# Patient Record
Sex: Female | Born: 1940
Health system: Southern US, Community
[De-identification: ages and names within clinical notes are randomized; demographics above are authoritative.]

## PROBLEM LIST (undated history)

## (undated) DIAGNOSIS — T7840XA Allergy, unspecified, initial encounter: Secondary | ICD-10-CM

## (undated) DIAGNOSIS — C3490 Malignant neoplasm of unspecified part of unspecified bronchus or lung: Secondary | ICD-10-CM

## (undated) HISTORY — DX: Malignant neoplasm of unspecified part of unspecified bronchus or lung: C34.90

## (undated) HISTORY — DX: Allergy, unspecified, initial encounter: T78.40XA

## (undated) HISTORY — PX: VAGINAL HYSTERECTOMY: SUR661

---

## 2017-04-04 DIAGNOSIS — R030 Elevated blood-pressure reading, without diagnosis of hypertension: Secondary | ICD-10-CM | POA: Diagnosis not present

## 2017-04-04 DIAGNOSIS — J309 Allergic rhinitis, unspecified: Secondary | ICD-10-CM | POA: Diagnosis not present

## 2017-04-04 DIAGNOSIS — R0602 Shortness of breath: Secondary | ICD-10-CM | POA: Diagnosis not present

## 2017-04-24 DIAGNOSIS — Z1389 Encounter for screening for other disorder: Secondary | ICD-10-CM | POA: Diagnosis not present

## 2017-04-24 DIAGNOSIS — R030 Elevated blood-pressure reading, without diagnosis of hypertension: Secondary | ICD-10-CM | POA: Diagnosis not present

## 2017-04-24 DIAGNOSIS — Z789 Other specified health status: Secondary | ICD-10-CM | POA: Diagnosis not present

## 2017-04-24 DIAGNOSIS — R5383 Other fatigue: Secondary | ICD-10-CM | POA: Diagnosis not present

## 2017-05-01 DIAGNOSIS — J309 Allergic rhinitis, unspecified: Secondary | ICD-10-CM | POA: Diagnosis not present

## 2017-05-01 DIAGNOSIS — R319 Hematuria, unspecified: Secondary | ICD-10-CM | POA: Diagnosis not present

## 2017-05-01 DIAGNOSIS — N183 Chronic kidney disease, stage 3 (moderate): Secondary | ICD-10-CM | POA: Diagnosis not present

## 2017-05-01 DIAGNOSIS — Z0001 Encounter for general adult medical examination with abnormal findings: Secondary | ICD-10-CM | POA: Diagnosis not present

## 2017-05-01 DIAGNOSIS — N390 Urinary tract infection, site not specified: Secondary | ICD-10-CM | POA: Diagnosis not present

## 2017-05-10 DIAGNOSIS — N390 Urinary tract infection, site not specified: Secondary | ICD-10-CM | POA: Diagnosis not present

## 2017-05-20 ENCOUNTER — Encounter: Payer: Self-pay | Admitting: *Deleted

## 2017-05-23 DIAGNOSIS — J310 Chronic rhinitis: Secondary | ICD-10-CM | POA: Diagnosis not present

## 2017-05-30 ENCOUNTER — Other Ambulatory Visit (HOSPITAL_COMMUNITY): Payer: Self-pay | Admitting: Pulmonary Disease

## 2017-05-30 DIAGNOSIS — J90 Pleural effusion, not elsewhere classified: Secondary | ICD-10-CM

## 2017-05-31 ENCOUNTER — Other Ambulatory Visit (HOSPITAL_COMMUNITY): Payer: Self-pay | Admitting: Pulmonary Disease

## 2017-05-31 ENCOUNTER — Ambulatory Visit (HOSPITAL_COMMUNITY)
Admission: RE | Admit: 2017-05-31 | Discharge: 2017-05-31 | Disposition: A | Discharge status: Home / Self Care | Payer: Medicare Other | Source: Ambulatory Visit | Attending: Pulmonary Disease | Admitting: Pulmonary Disease

## 2017-05-31 ENCOUNTER — Encounter (HOSPITAL_COMMUNITY): Payer: Self-pay

## 2017-05-31 DIAGNOSIS — K769 Liver disease, unspecified: Secondary | ICD-10-CM | POA: Diagnosis not present

## 2017-05-31 DIAGNOSIS — R590 Localized enlarged lymph nodes: Secondary | ICD-10-CM | POA: Diagnosis not present

## 2017-05-31 DIAGNOSIS — J90 Pleural effusion, not elsewhere classified: Secondary | ICD-10-CM

## 2017-05-31 DIAGNOSIS — C779 Secondary and unspecified malignant neoplasm of lymph node, unspecified: Secondary | ICD-10-CM | POA: Diagnosis not present

## 2017-05-31 DIAGNOSIS — R918 Other nonspecific abnormal finding of lung field: Secondary | ICD-10-CM | POA: Diagnosis not present

## 2017-05-31 LAB — POCT I-STAT CREATININE: CREATININE: 1.5 mg/dL — AB (ref 0.44–1.00)

## 2017-05-31 MED ORDER — IOPAMIDOL (ISOVUE-300) INJECTION 61%
100.0000 mL | Freq: Once | INTRAVENOUS | Status: AC | PRN
  Administered 2017-05-31: 60 mL via INTRAVENOUS

## 2017-05-31 NOTE — Sedation Documentation (Signed)
No thoracentesis done.  Pt to go for chest CT.

## 2017-06-05 DIAGNOSIS — J449 Chronic obstructive pulmonary disease, unspecified: Secondary | ICD-10-CM | POA: Diagnosis not present

## 2017-06-05 DIAGNOSIS — R918 Other nonspecific abnormal finding of lung field: Secondary | ICD-10-CM | POA: Diagnosis not present

## 2017-06-05 DIAGNOSIS — E46 Unspecified protein-calorie malnutrition: Secondary | ICD-10-CM | POA: Diagnosis not present

## 2017-06-10 NOTE — Patient Instructions (Signed)
Brandy Gray  06/10/2017     _0 @   Your procedure is scheduled on  06/13/2017 .  Report to [facility] at  615   A.M.  Call this number if you have problems the morning of surgery:  [phone]   Remember:  No food after midnight.  You may drink clear liquids until 12 midnight 06/12/2017 .  Clear liquids allowed are:                    Water, Juice (non-citric and without pulp), Carbonated beverages, Clear Tea, Black Coffee only, Plain Jell-O only, Gatorade and Plain Popsicles only    Take these medicines the morning of surgery with A SIP OF WATER  none    Do not wear jewelry, make-up or nail polish.  Do not wear lotions, powders, or perfumes, or deodorant.  Do not shave 48 hours prior to surgery.  Men may shave face and neck.  Do not bring valuables to the hospital.  [facility] is not responsible for any belongings or valuables.  Contacts, dentures or bridgework may not be worn into surgery.  Leave your suitcase in the car.  After surgery it may be brought to your room.  For patients admitted to the hospital, discharge time will be determined by your treatment team.  Patients discharged the day of surgery will not be allowed to drive home.   Name and phone number of your driver:   family Special instructions:  None  Please read over the following fact sheets that you were given. Pain Booklet, Coughing and Deep Breathing, Anesthesia Post-op Instructions and Care and Recovery After Surgery      Flexible Bronchoscopy Bronchoscopy is a procedure used to examine the passageways in the lungs. During the procedure a thin, flexible tool with a lens and camera or eyepiece is passed in your mouth or nose, down the windpipe (trachea), and into the air tubes (bronchi). This tool allows your health care provider to carefully look at your lungs from the inside and take diagnostic samples if needed. Tell a health care provider about:  Allergies to food  or medicine.  All medicines you are taking, including blood thinners, vitamins, herbs, eye drops, creams, and over-the-counter medicines.  Any problems you or family members have had with anesthetic medicines.  Any blood disorders you have.  Any surgeries you have had.  Medical conditions you have, including heart disease, diabetes, or kidney problems.  Possibility of pregnancy, if this applies. What are the risks? Generally, this is a safe procedure. However, as with any procedure, problems can occur. Possible problems include:  Collapsed lung (pneumothorax).  Bleeding.  Increased need for oxygen or difficulty breathing after the procedure.  What happens before the procedure? Do not eat or drink anything after midnight on the night before the procedure or as directed by your health care provider. What happens during the procedure?  Relax as much as possible during the procedure.  Medicines may be given to relax you, dry up your secretions, and control coughing.  A numbing medicine (local anesthetic) will be given to numb your mouth, nose, throat, and voice box (larynx). You will be able to breathe normally during the procedure.  Samples of airway secretions may be collected for testing.  If abnormal areas are seen in your airways, tissue samples may be taken for examination under a microscope (biopsy).  If tissue samples are needed from the  outer portions of the lung, a type of X-ray called fluoroscopy may be done.  If bleeding occurs, a drug may be used to stop or decrease the bleeding. What happens after the procedure?  You may receive a chest X-ray following the procedure. This is to make sure the lungs have not collapsed (pneumothorax). This information is not intended to replace advice given to you by your health care provider. Make sure you discuss any questions you have with your health care provider. Document Released: 12/23/1999 Document Revised: 06/02/2015  Document Reviewed: 08/29/2012 Elsevier Interactive Patient Education  2017 [place].  Flexible Bronchoscopy, Care After These instructions give you information on caring for yourself after your procedure. Your doctor may also give you more specific instructions. Call your doctor if you have any problems or questions after your procedure. Follow these instructions at home:  Do not eat or drink anything for 2 hours after your procedure. If you try to eat or drink before the medicine wears off, food or drink could go into your lungs. You could also burn yourself.  After 2 hours have passed and when you can cough and gag normally, you may eat soft food and drink liquids slowly.  The day after the test, you may eat your normal diet.  You may do your normal activities.  Keep all doctor visits. Get help right away if:  You get more and more short of breath.  You get light-headed.  You feel like you are going to pass out (faint).  You have chest pain.  You have new problems that worry you.  You cough up more than a little blood.  You cough up more blood than before. This information is not intended to replace advice given to you by your health care provider. Make sure you discuss any questions you have with your health care provider. Document Released: 10/22/2008 Document Revised: 06/02/2015 Document Reviewed: 08/29/2012 Elsevier Interactive Patient Education  2017 [place] Anesthesia is a term that refers to techniques, procedures, and medicines that help a person stay safe and comfortable during a medical procedure. Monitored anesthesia care, or sedation, is one type of anesthesia. Your anesthesia specialist may recommend sedation if you will be having a procedure that does not require you to be unconscious, such as:  Cataract surgery.  A dental procedure.  A biopsy.  A colonoscopy.  During the procedure, you may receive a medicine to help  you relax (sedative). There are three levels of sedation:  Mild sedation. At this level, you may feel awake and relaxed. You will be able to follow directions.  Moderate sedation. At this level, you will be sleepy. You may not remember the procedure.  Deep sedation. At this level, you will be asleep. You will not remember the procedure.  The more medicine you are given, the deeper your level of sedation will be. Depending on how you respond to the procedure, the anesthesia specialist may change your level of sedation or the type of anesthesia to fit your needs. An anesthesia specialist will monitor you closely during the procedure. Let your health care provider know about:  Any allergies you have.  All medicines you are taking, including vitamins, herbs, eye drops, creams, and over-the-counter medicines.  Any use of steroids (by mouth or as a cream).  Any problems you or family members have had with sedatives and anesthetic medicines.  Any blood disorders you have.  Any surgeries you have had.  Any medical conditions  you have, such as sleep apnea.  Whether you are pregnant or may be pregnant.  Any use of cigarettes, alcohol, or street drugs. What are the risks? Generally, this is a safe procedure. However, problems may occur, including:  Getting too much medicine (oversedation).  Nausea.  Allergic reaction to medicines.  Trouble breathing. If this happens, a breathing tube may be used to help with breathing. It will be removed when you are awake and breathing on your own.  Heart trouble.  Lung trouble.  Before the procedure Staying hydrated Follow instructions from your health care provider about hydration, which may include:  Up to 2 hours before the procedure - you may continue to drink clear liquids, such as water, clear fruit juice, black coffee, and plain tea.  Eating and drinking restrictions Follow instructions from your health care provider about eating and  drinking, which may include:  8 hours before the procedure - stop eating heavy meals or foods such as meat, fried foods, or fatty foods.  6 hours before the procedure - stop eating light meals or foods, such as toast or cereal.  6 hours before the procedure - stop drinking milk or drinks that contain milk.  2 hours before the procedure - stop drinking clear liquids.  Medicines Ask your health care provider about:  Changing or stopping your regular medicines. This is especially important if you are taking diabetes medicines or blood thinners.  Taking medicines such as aspirin and ibuprofen. These medicines can thin your blood. Do not take these medicines before your procedure if your health care provider instructs you not to.  Tests and exams  You will have a physical exam.  You may have blood tests done to show: ? How well your kidneys and liver are working. ? How well your blood can clot.  General instructions  Plan to have someone take you home from the hospital or clinic.  If you will be going home right after the procedure, plan to have someone with you for 24 hours.  What happens during the procedure?  Your blood pressure, heart rate, breathing, level of pain and overall condition will be monitored.  An IV tube will be inserted into one of your veins.  Your anesthesia specialist will give you medicines as needed to keep you comfortable during the procedure. This may mean changing the level of sedation.  The procedure will be performed. After the procedure  Your blood pressure, heart rate, breathing rate, and blood oxygen level will be monitored until the medicines you were given have worn off.  Do not drive for 24 hours if you received a sedative.  You may: ? Feel sleepy, clumsy, or nauseous. ? Feel forgetful about what happened after the procedure. ? Have a sore throat if you had a breathing tube during the procedure. ? Vomit. This information is not intended  to replace advice given to you by your health care provider. Make sure you discuss any questions you have with your health care provider. Document Released: 09/20/2004 Document Revised: 06/03/2015 Document Reviewed: 04/17/2015 Elsevier Interactive Patient Education  2018 [place], Care After These instructions provide you with information about caring for yourself after your procedure. Your health care provider may also give you more specific instructions. Your treatment has been planned according to current medical practices, but problems sometimes occur. Call your health care provider if you have any problems or questions after your procedure. What can I expect after the procedure? After your  procedure, it is common to:  Feel sleepy for several hours.  Feel clumsy and have poor balance for several hours.  Feel forgetful about what happened after the procedure.  Have poor judgment for several hours.  Feel nauseous or vomit.  Have a sore throat if you had a breathing tube during the procedure.  Follow these instructions at home: For at least 24 hours after the procedure:   Do not: ? Participate in activities in which you could fall or become injured. ? Drive. ? Use heavy machinery. ? Drink alcohol. ? Take sleeping pills or medicines that cause drowsiness. ? Make important decisions or sign legal documents. ? Take care of children on your own.  Rest. Eating and drinking  Follow the diet that is recommended by your health care provider.  If you vomit, drink water, juice, or soup when you can drink without vomiting.  Make sure you have little or no nausea before eating solid foods. General instructions  Have a responsible adult stay with you until you are awake and alert.  Take over-the-counter and prescription medicines only as told by your health care provider.  If you smoke, do not smoke without supervision.  Keep all follow-up visits  as told by your health care provider. This is important. Contact a health care provider if:  You keep feeling nauseous or you keep vomiting.  You feel light-headed.  You develop a rash.  You have a fever. Get help right away if:  You have trouble breathing. This information is not intended to replace advice given to you by your health care provider. Make sure you discuss any questions you have with your health care provider. Document Released: 04/17/2015 Document Revised: 08/17/2015 Document Reviewed: 04/17/2015 Elsevier Interactive Patient Education  [employer].

## 2017-06-11 ENCOUNTER — Other Ambulatory Visit: Payer: Self-pay

## 2017-06-11 ENCOUNTER — Encounter (HOSPITAL_COMMUNITY): Payer: Self-pay

## 2017-06-11 ENCOUNTER — Encounter (HOSPITAL_COMMUNITY)
Admission: RE | Admit: 2017-06-11 | Discharge: 2017-06-11 | Disposition: A | Discharge status: Home / Self Care | Payer: Medicare Other | Source: Ambulatory Visit | Attending: Pulmonary Disease | Admitting: Pulmonary Disease

## 2017-06-11 DIAGNOSIS — C3401 Malignant neoplasm of right main bronchus: Secondary | ICD-10-CM | POA: Diagnosis not present

## 2017-06-11 DIAGNOSIS — R918 Other nonspecific abnormal finding of lung field: Secondary | ICD-10-CM | POA: Diagnosis present

## 2017-06-11 DIAGNOSIS — F17210 Nicotine dependence, cigarettes, uncomplicated: Secondary | ICD-10-CM | POA: Diagnosis not present

## 2017-06-11 DIAGNOSIS — J449 Chronic obstructive pulmonary disease, unspecified: Secondary | ICD-10-CM | POA: Diagnosis not present

## 2017-06-11 DIAGNOSIS — N189 Chronic kidney disease, unspecified: Secondary | ICD-10-CM | POA: Diagnosis not present

## 2017-06-11 LAB — CBC WITH DIFFERENTIAL/PLATELET
BASOS PCT: 0 %
Basophils Absolute: 0 10*3/uL (ref 0.0–0.1)
Eosinophils Absolute: 0.1 10*3/uL (ref 0.0–0.7)
Eosinophils Relative: 1 %
HEMATOCRIT: 47.2 % — AB (ref 36.0–46.0)
HEMOGLOBIN: 15.2 g/dL — AB (ref 12.0–15.0)
Lymphocytes Relative: 10 %
Lymphs Abs: 1.1 10*3/uL (ref 0.7–4.0)
MCH: 30.8 pg (ref 26.0–34.0)
MCHC: 32.2 g/dL (ref 30.0–36.0)
MCV: 95.7 fL (ref 78.0–100.0)
Monocytes Absolute: 0.9 10*3/uL (ref 0.1–1.0)
Monocytes Relative: 8 %
NEUTROS ABS: 9 10*3/uL — AB (ref 1.7–7.7)
NEUTROS PCT: 81 %
Platelets: 232 10*3/uL (ref 150–400)
RBC: 4.93 MIL/uL (ref 3.87–5.11)
RDW: 13.6 % (ref 11.5–15.5)
WBC: 11 10*3/uL — ABNORMAL HIGH (ref 4.0–10.5)

## 2017-06-11 LAB — BASIC METABOLIC PANEL
ANION GAP: 13 (ref 5–15)
BUN: 52 mg/dL — ABNORMAL HIGH (ref 6–20)
CALCIUM: 9.9 mg/dL (ref 8.9–10.3)
CHLORIDE: 103 mmol/L (ref 101–111)
CO2: 26 mmol/L (ref 22–32)
Creatinine, Ser: 1.5 mg/dL — ABNORMAL HIGH (ref 0.44–1.00)
GFR calc non Af Amer: 32 mL/min — ABNORMAL LOW (ref >60)
GFR, EST AFRICAN AMERICAN: 37 mL/min — AB (ref >60)
Glucose, Bld: 128 mg/dL — ABNORMAL HIGH (ref 65–99)
POTASSIUM: 3.8 mmol/L (ref 3.5–5.1)
Sodium: 142 mmol/L (ref 135–145)

## 2017-06-11 NOTE — Pre-Procedure Instructions (Signed)
Labs routed to PCP and to Dr Luan Pulling.

## 2017-06-13 ENCOUNTER — Encounter (HOSPITAL_COMMUNITY): Payer: Self-pay | Admitting: *Deleted

## 2017-06-13 ENCOUNTER — Ambulatory Visit (HOSPITAL_COMMUNITY)
Admission: RE | Admit: 2017-06-13 | Discharge: 2017-06-13 | Disposition: A | Discharge status: Home / Self Care | Payer: Medicare Other | Source: Ambulatory Visit | Attending: Pulmonary Disease | Admitting: Pulmonary Disease

## 2017-06-13 ENCOUNTER — Ambulatory Visit: Payer: Medicare Other | Admitting: Family Medicine

## 2017-06-13 ENCOUNTER — Encounter (HOSPITAL_COMMUNITY)
Admission: RE | Disposition: A | Discharge status: Home / Self Care | Payer: Self-pay | Source: Ambulatory Visit | Attending: Pulmonary Disease

## 2017-06-13 ENCOUNTER — Ambulatory Visit (HOSPITAL_COMMUNITY): Payer: Medicare Other | Admitting: Anesthesiology

## 2017-06-13 DIAGNOSIS — J9809 Other diseases of bronchus, not elsewhere classified: Secondary | ICD-10-CM | POA: Diagnosis not present

## 2017-06-13 DIAGNOSIS — J449 Chronic obstructive pulmonary disease, unspecified: Secondary | ICD-10-CM | POA: Insufficient documentation

## 2017-06-13 DIAGNOSIS — R918 Other nonspecific abnormal finding of lung field: Secondary | ICD-10-CM | POA: Diagnosis not present

## 2017-06-13 DIAGNOSIS — N189 Chronic kidney disease, unspecified: Secondary | ICD-10-CM | POA: Diagnosis not present

## 2017-06-13 DIAGNOSIS — C3491 Malignant neoplasm of unspecified part of right bronchus or lung: Secondary | ICD-10-CM | POA: Diagnosis not present

## 2017-06-13 DIAGNOSIS — R845 Abnormal microbiological findings in specimens from respiratory organs and thorax: Secondary | ICD-10-CM | POA: Diagnosis not present

## 2017-06-13 DIAGNOSIS — F17210 Nicotine dependence, cigarettes, uncomplicated: Secondary | ICD-10-CM | POA: Diagnosis not present

## 2017-06-13 DIAGNOSIS — C3401 Malignant neoplasm of right main bronchus: Secondary | ICD-10-CM | POA: Insufficient documentation

## 2017-06-13 HISTORY — PX: FLEXIBLE BRONCHOSCOPY: SHX5094

## 2017-06-13 HISTORY — PX: BIOPSY: SHX5522

## 2017-06-13 SURGERY — BRONCHOSCOPY, FLEXIBLE
Anesthesia: General

## 2017-06-13 MED ORDER — LACTATED RINGERS IV SOLN
INTRAVENOUS | Status: DC
  Administered 2017-06-13: 1000 mL via INTRAVENOUS

## 2017-06-13 MED ORDER — LIDOCAINE HCL (PF) 2 % IJ SOLN
INTRAMUSCULAR | Status: AC
Start: 2017-06-13 — End: ?
  Filled 2017-06-13: qty 10

## 2017-06-13 MED ORDER — EPHEDRINE SULFATE 50 MG/ML IJ SOLN
INTRAMUSCULAR | Status: AC
  Filled 2017-06-13: qty 2

## 2017-06-13 MED ORDER — LIDOCAINE HCL (PF) 1 % IJ SOLN
INTRAMUSCULAR | Status: AC
  Filled 2017-06-13: qty 5

## 2017-06-13 MED ORDER — HYDROMORPHONE HCL 1 MG/ML IJ SOLN: 0.2500 mg | INTRAMUSCULAR | Status: DC | PRN

## 2017-06-13 MED ORDER — LIDOCAINE HCL (PF) 2 % IJ SOLN
INTRAMUSCULAR | Status: DC | PRN
  Administered 2017-06-13: 10 mL

## 2017-06-13 MED ORDER — KETOROLAC TROMETHAMINE 30 MG/ML IJ SOLN: 30.0000 mg | Freq: Once | INTRAMUSCULAR | Status: DC | PRN

## 2017-06-13 MED ORDER — ONDANSETRON HCL 4 MG/2ML IJ SOLN: 4.0000 mg | Freq: Once | INTRAMUSCULAR | Status: DC | PRN

## 2017-06-13 MED ORDER — PROPOFOL 10 MG/ML IV BOLUS
INTRAVENOUS | Status: AC
  Filled 2017-06-13: qty 20

## 2017-06-13 MED ORDER — LIDOCAINE HCL (PF) 2 % IJ SOLN
INTRAMUSCULAR | Status: AC
  Filled 2017-06-13: qty 20

## 2017-06-13 MED ORDER — PROPOFOL 10 MG/ML IV BOLUS
INTRAVENOUS | Status: DC | PRN
  Administered 2017-06-13: 10 mg via INTRAVENOUS
  Administered 2017-06-13: 20 mg via INTRAVENOUS
  Administered 2017-06-13: 10 mg via INTRAVENOUS

## 2017-06-13 MED ORDER — HYDROCODONE-ACETAMINOPHEN 7.5-325 MG PO TABS: 1.0000 | ORAL_TABLET | Freq: Once | ORAL | Status: DC | PRN

## 2017-06-13 MED ORDER — MEPERIDINE HCL 100 MG/ML IJ SOLN: 6.2500 mg | INTRAMUSCULAR | Status: DC | PRN

## 2017-06-13 MED ORDER — MIDAZOLAM HCL 2 MG/2ML IJ SOLN
INTRAMUSCULAR | Status: AC
  Filled 2017-06-13: qty 2

## 2017-06-13 MED ORDER — SODIUM CHLORIDE 0.9 % IJ SOLN
INTRAMUSCULAR | Status: AC
Start: 2017-06-13 — End: ?
  Filled 2017-06-13: qty 10

## 2017-06-13 MED ORDER — PROPOFOL 500 MG/50ML IV EMUL
INTRAVENOUS | Status: DC | PRN
  Administered 2017-06-13: 75 ug/kg/min via INTRAVENOUS

## 2017-06-13 SURGICAL SUPPLY — 16 items
BRUSH CYTOL CELLEBRITY 1.5X140 (MISCELLANEOUS) ×4 IMPLANT
CLOTH BEACON ORANGE TIMEOUT ST (SAFETY) ×4 IMPLANT
CONNECTOR 5 IN 1 STRAIGHT STRL (MISCELLANEOUS) ×4 IMPLANT
FORCEPS BIOP RJ4 1.8 (CUTTING FORCEPS) ×4 IMPLANT
GLOVE BIO SURGEON STRL SZ7.5 (GLOVE) ×4 IMPLANT
KIT CLEAN CATCH URINE (SET/KITS/TRAYS/PACK) IMPLANT
MARKER SKIN DUAL TIP RULER LAB (MISCELLANEOUS) ×4 IMPLANT
NS IRRIG 1000ML POUR BTL (IV SOLUTION) ×4 IMPLANT
SPONGE GAUZE 4X4 12PLY (GAUZE/BANDAGES/DRESSINGS) ×4 IMPLANT
SYR 20CC LL (SYRINGE) ×4 IMPLANT
SYR 30ML LL (SYRINGE) ×4 IMPLANT
SYR CONTROL 10ML LL (SYRINGE) ×4 IMPLANT
TRAP SPECIMEN CP (MISCELLANEOUS) ×4 IMPLANT
VALVE DISPOSABLE (MISCELLANEOUS) ×4 IMPLANT
WATER STERILE IRR 1000ML POUR (IV SOLUTION) ×4 IMPLANT
YANKAUER SUCT BULB TIP 10FT TU (MISCELLANEOUS) ×8 IMPLANT

## 2017-06-13 NOTE — Anesthesia Postprocedure Evaluation (Signed)
Anesthesia Post Note  Patient: Brandy Gray  Procedure(s) Performed: FLEXIBLE BRONCHOSCOPY (N/A ) BIOPSY  Patient location during evaluation: Short Stay Anesthesia Type: General Level of consciousness: awake and alert and patient cooperative Pain management: satisfactory to patient Vital Signs Assessment: post-procedure vital signs reviewed and stable Respiratory status: spontaneous breathing Cardiovascular status: stable Postop Assessment: no apparent nausea or vomiting Anesthetic complications: no     Last Vitals:  Vitals:   06/13/17 0810 06/13/17 0815  BP:  124/70  Pulse:  91  Resp:  18  Temp:    SpO2: 90% 98%    Last Pain:  Vitals:   06/13/17 0805  TempSrc:   PainSc: 0-No pain                 Gary Gabrielsen

## 2017-06-13 NOTE — Op Note (Signed)
Bronchoscopy Procedure Note Brandy Gray [id number] October 30, 1940  Procedure: Bronchoscopy Indications: Diagnostic evaluation of the airways  Procedure Details Consent: Risks of procedure as well as the alternatives and risks of each were explained to the (patient/caregiver).  Consent for procedure obtained. Time Out: Verified patient identification, verified procedure, site/side was marked, verified correct patient position, special equipment/implants available, medications/allergies/relevent history reviewed, required imaging and test results available.  Performed  In preparation for procedure, bronchoscope lubricated. Sedation: Propofol via anesthesia  Airway entered and the following bronchi were examined: Bronchi.  Left upper and lower lobes were normal.  There was an endobronchial lesion that was invading the bronchus just above the carina.  There was a large endobronchial lesion in the right mainstem bronchus. Procedures performed: Brushings performed Bronchoscope removed.    Evaluation Hemodynamic Status: BP stable throughout; O2 sats: stable throughout Patient's Current Condition: stable Specimens:  Sent serosanguinous fluid Complications: No apparent complications Patient did tolerate procedure well.  Endobronchial biopsies of the right main bronchus lesion were done x6.  Brushings were then performed.  Washings were then performed.  All were sent for cytology/pathology  Alinda Egolf L 06/13/2017

## 2017-06-13 NOTE — Discharge Instructions (Signed)
Flexible Bronchoscopy, Care After These instructions give you information on caring for yourself after your procedure. Your doctor may also give you more specific instructions. Call your doctor if you have any problems or questions after your procedure. Follow these instructions at home:  Do not eat or drink anything for 2 hours after your procedure. If you try to eat or drink before the medicine wears off, food or drink could go into your lungs. You could also burn yourself.  After 2 hours have passed and when you can cough and gag normally, you may eat soft food and drink liquids slowly.  The day after the test, you may eat your normal diet.  You may do your normal activities.  Keep all doctor visits. Get help right away if:  You get more and more short of breath.  You get light-headed.  You feel like you are going to pass out (faint).  You have chest pain.  You have new problems that worry you.  You cough up more than a little blood.  You cough up more blood than before. This information is not intended to replace advice given to you by your health care provider. Make sure you discuss any questions you have with your health care provider. Document Released: 10/22/2008 Document Revised: 06/02/2015 Document Reviewed: 08/29/2012 Elsevier Interactive Patient Education  2017 [employer].

## 2017-06-13 NOTE — Anesthesia Preprocedure Evaluation (Signed)
Anesthesia Evaluation  Patient identified by MRN, date of birth, ID band Patient awake    Reviewed: Allergy & Precautions, H&P , NPO status , Patient's Chart, lab work & pertinent test results  Airway Mallampati: II  TM Distance: >3 FB Neck ROM: full    Dental no notable dental hx.    Pulmonary neg pulmonary ROS, Current Smoker,    Pulmonary exam normal breath sounds clear to auscultation       Cardiovascular Exercise Tolerance: Good negative cardio ROS   Rhythm:regular Rate:Normal     Neuro/Psych negative neurological ROS  negative psych ROS   GI/Hepatic negative GI ROS, Neg liver ROS,   Endo/Other  negative endocrine ROS  Renal/GU negative Renal ROS  negative genitourinary   Musculoskeletal   Abdominal   Peds  Hematology negative hematology ROS (+)   Anesthesia Other Findings   Reproductive/Obstetrics negative OB ROS                             Anesthesia Physical Anesthesia Plan  ASA: III  Anesthesia Plan: General   Post-op Pain Management:    Induction:   PONV Risk Score and Plan:   Airway Management Planned:   Additional Equipment:   Intra-op Plan:   Post-operative Plan:   Informed Consent: I have reviewed the patients History and Physical, chart, labs and discussed the procedure including the risks, benefits and alternatives for the proposed anesthesia with the patient or authorized representative who has indicated his/her understanding and acceptance.   Dental Advisory Given  Plan Discussed with: CRNA  Anesthesia Plan Comments:         Anesthesia Quick Evaluation

## 2017-06-13 NOTE — Transfer of Care (Signed)
Immediate Anesthesia Transfer of Care Note  Patient: Brandy Gray  Procedure(s) Performed: FLEXIBLE BRONCHOSCOPY (N/A ) BIOPSY  Patient Location: PACU  Anesthesia Type:MAC  Level of Consciousness: awake and alert   Airway & Oxygen Therapy: Patient Spontanous Breathing  Post-op Assessment: Report given to RN  Post vital signs: Reviewed and stable  Last Vitals:  Vitals Value Taken Time  BP    Temp    Pulse    Resp    SpO2      Last Pain:  Vitals:   06/13/17 0650  TempSrc: Oral  PainSc: 0-No pain      Patients Stated Pain Goal: 5 (21/97/58 8325)  Complications: No apparent anesthesia complications

## 2017-06-13 NOTE — H&P (Signed)
Brandy Gray MRN: [id number] DOB/AGE: November 02, 1940 77 y.o. Primary Care Physician:Pickard, Cammie Mcgee, MD Admit date: 06/13/2017 Chief Complaint: abnormal chest x ray HPI: This is a 77 year old who has been sick for about 3 or 4 months.  She started with what seemed to be an upper respiratory infection and she went to her primary care physician.  She was treated but did not improve.  Eventually she went to an urgent care center where she had a chest x-ray done and this showed that she had a right pleural effusion.  She was referred to my office.  When I saw her she gave a history of about 20 pound weight loss she has a 25+ pack year smoking history and has been coughing.  She is on no regular medications.  She does not have any other medical problems although on her blood work for preop she had some moderate chronic renal failure.  She has been coughing.  Her cough is mostly been nonproductive.  She has not coughed up any blood.  She has not complained of any chest pain except some on the right side of her chest when she takes of breath.  No nausea vomiting or diarrhea.  No headaches.  She had CT of the chest which was markedly abnormal and has been scheduled for bronchoscopy  Past Medical History:  Diagnosis Date  . Allergy    Past Surgical History:  Procedure Laterality Date  . VAGINAL HYSTERECTOMY          History reviewed. No pertinent family history. She is unaware of any family history of lung disease particularly of lung cancer Social History:  reports that she has been smoking cigarettes.  She has a 27.50 pack-year smoking history. She has never used smokeless tobacco. She reports that she does not drink alcohol or use drugs.   Allergies:  Allergies  Allergen Reactions  . Codeine Swelling and Rash    Swelling around eyes    No medications prior to admission.       OHY:WVPXT from the symptoms mentioned above,there are no other symptoms referable to all systems reviewed.  10  point review of systems otherwise negative  Physical Exam: Blood pressure (!) 147/88, pulse 93, temperature 98.2 F (36.8 C), temperature source Oral, resp. rate 20, SpO2 91 %. Constitutional: She is thin.  Eyes: Pupils react EOMI.  Ears nose mouth and throat: Her mucous membranes are moist.  Hearing is grossly normal.  Her neck is supple.  Respiratory: Her respiratory effort is normal.  She has bilateral rhonchi.  Cardiovascular: Her heart is regular with normal heart sounds.  No edema.  Gastrointestinal: Her abdomen is soft with no masses.  Skin: Warm and dry.  Musculoskeletal: Normal strength bilaterally.  Neurological: No focal abnormalities.  Psychiatric: Normal mood and affect   Recent Labs    06/11/17 1342  WBC 11.0*  NEUTROABS 9.0*  HGB 15.2*  HCT 47.2*  MCV 95.7  PLT 232   Recent Labs    06/11/17 1342  NA 142  K 3.8  CL 103  CO2 26  GLUCOSE 128*  BUN 52*  CREATININE 1.50*  CALCIUM 9.9  lablast2(ast:2,ALT:2,alkphos:2,bilitot:2,prot:2,albumin:2)@    No results found for this or any previous visit (from the past 240 hour(s)).   Ct Chest W Contrast  Result Date: 05/31/2017 CLINICAL DATA:  RIGHT hilar mass on chest radiograph EXAM: CT CHEST WITH CONTRAST TECHNIQUE: Multidetector CT imaging of the chest was performed during intravenous contrast administration. CONTRAST:  57mL ISOVUE-300  IOPAMIDOL (ISOVUE-300) INJECTION 61% COMPARISON:  5239 FINDINGS: Cardiovascular: RIGHT main pulmonary artery is narrowed by the mediastinal mass. No acute pulmonary embolism identified. Aorta normal. Mediastinum/Nodes: No axillary or supraclavicular adenopathy. Bulky mass in the RIGHT mediastinum extends into the paratracheal nodal station, subcarinal nodal station and surrounding bronchus intermedius. This mass obliterates the bronchus intermedius (image 58/5). The portion the mass in the RIGHT hilum/RIGHT lower paratracheal region measures 3.5 x 3.0 cm. The RIGHT infrahilar portion of the  mass which extends into the subcarinal location measures 4.9 x 4.7 cm. These two masses are contiguous with each other. Lungs/Pleura: There is postobstructive pneumonitis and collapse of the RIGHT lower lobe. There is a potential separate mass in the RIGHT lower lobe inferior to the hilar mass measuring 4.5 by 4.3 cm (image 81/2). Small RIGHT effusion. LEFT lung is hyperexpanded. No discrete lesion Endobronchial lesion in the RIGHT side of the carina (image 57/2 Upper Abdomen: There is heterogeneous enhancement pattern of the liver with a lobular contour more. Potential mass lesion in the central LEFT hepatic lobe measuring 4.4 cm x 9.0 cm (image 128/20. Enlargement of the LEFT adrenal gland to 13 mm Musculoskeletal: No aggressive osseous lesion IMPRESSION: 1. Bulky multilobular metastatic adenopathy invading the RIGHT mediastinum. Favor a primary pulmonary lesion within the collapsed RIGHT lower lobe. 2. Infiltrative adenopathy extending the mediastinum which obliterates the RIGHT lower lobe bronchus. Endobronchial extension at the level the carina on the RIGHT. 3. Lobular contour of the liver with heterogeneous enhancement and potential large central LEFT hepatic lobe lesion most consistent with hepatic metastasis 4. Probable LEFT adrenal metastasis. 5. Findings are suggestive of small cell bronchogenic carcinoma. If multidisciplinary follow up management is desired, this is available in the [place] through the [facility] [phone]. These results will be called to the ordering clinician or representative by the Radiologist Assistant, and communication documented in the PACS or zVision Dashboard. Electronically Signed   By: Suzy Bouchard M.D.   On: 05/31/2017 14:46   [place] Chest (pleural Effusion)  Result Date: 05/31/2017 CLINICAL DATA:  Abnormal chest radiograph, RIGHT pleural effusion EXAM: CHEST ULTRASOUND COMPARISON:  Chest radiograph 05/30/2017 FINDINGS: Sonography of  the RIGHT pleural space performed in anticipation of thoracentesis. Only a small RIGHT pleural effusion is identified. Volume of RIGHT pleural fluid present is insufficient for thoracentesis. IMPRESSION: Insufficient RIGHT pleural effusion for thoracentesis. Discussed with Dr. Luan Pulling; thoracentesis will not be performed and will instead proceed with already planned CT chest with contrast. Electronically Signed   By: Lavonia Dana M.D.   On: 05/31/2017 14:16   Impression: She has significantly abnormal CT scan with multilobar metastatic adenopathy in the right mediastinum collapsed right lower lobe adenopathy extending into the mediastinum which obliterates the right lower lobe bronchus endobronchial lesion at the level of the carina on the right lobular contour of the liver with likely hepatic metastatic disease probable left adrenal metastatic disease.  She has COPD as well  She has renal failure which is a new finding Active Problems:   * No active hospital problems. *     Plan: For bronchoscopy with biopsies      Mikhala Kenan L   06/13/2017, 7:18 AM

## 2017-06-19 ENCOUNTER — Other Ambulatory Visit: Payer: Self-pay

## 2017-06-19 ENCOUNTER — Inpatient Hospital Stay (HOSPITAL_COMMUNITY): Payer: Medicare Other | Attending: Hematology | Admitting: Hematology

## 2017-06-19 ENCOUNTER — Encounter (HOSPITAL_COMMUNITY): Payer: Self-pay | Admitting: Hematology

## 2017-06-19 DIAGNOSIS — C3490 Malignant neoplasm of unspecified part of unspecified bronchus or lung: Secondary | ICD-10-CM

## 2017-06-19 DIAGNOSIS — R634 Abnormal weight loss: Secondary | ICD-10-CM | POA: Diagnosis not present

## 2017-06-19 DIAGNOSIS — Z5111 Encounter for antineoplastic chemotherapy: Secondary | ICD-10-CM | POA: Diagnosis not present

## 2017-06-19 DIAGNOSIS — F17210 Nicotine dependence, cigarettes, uncomplicated: Secondary | ICD-10-CM | POA: Diagnosis not present

## 2017-06-19 DIAGNOSIS — J9819 Other pulmonary collapse: Secondary | ICD-10-CM | POA: Diagnosis not present

## 2017-06-19 DIAGNOSIS — R05 Cough: Secondary | ICD-10-CM | POA: Diagnosis not present

## 2017-06-19 DIAGNOSIS — C781 Secondary malignant neoplasm of mediastinum: Secondary | ICD-10-CM | POA: Diagnosis not present

## 2017-06-19 DIAGNOSIS — Z7189 Other specified counseling: Secondary | ICD-10-CM | POA: Insufficient documentation

## 2017-06-19 DIAGNOSIS — Z79899 Other long term (current) drug therapy: Secondary | ICD-10-CM | POA: Diagnosis not present

## 2017-06-19 MED ORDER — PROCHLORPERAZINE MALEATE 10 MG PO TABS: 10.0000 mg | ORAL_TABLET | Freq: Four times a day (QID) | ORAL | 3 refills | Status: AC | PRN

## 2017-06-19 MED ORDER — LIDOCAINE-PRILOCAINE 2.5-2.5 % EX CREA: TOPICAL_CREAM | CUTANEOUS | 3 refills | Status: AC

## 2017-06-19 NOTE — Assessment & Plan Note (Addendum)
1.  Extensive stage small cell lung cancer: - CT scan of the chest on 05/31/2017 shows bulky multilobular metastatic adenopathy invading the right mediastinum, collapsed right lower lobe, infiltrative adenopathy extending the mediastinum obliterating the right lower lobe bronchus, possible left hepatic metastasis, left adrenal metastasis. -Bronchoscopy on 06/13/2017 showing endobronchial lesion just above the carina and a large endobronchial lesion in the right mainstem bronchus, biopsy consistent with small cell lung cancer - Would order brain MRI with and without gadolinium for staging.  Also obtain CT scan of the abdomen and pelvis to complete staging. - We talked about the normal prognosis of extensive stage small cell lung cancer.  From the time of diagnosis, median survival with treatment is around 12 months.  Less than 5% of the patient survive beyond 2 years.  Treatment is recommended in the palliative setting for symptom control and prolonging life.  Goals of care were clearly discussed.  Upon discussion, patient would like to try at least 1-2 cycles and see how she tolerates it. - Would recommend combination chemoimmunotherapy with carboplatin (AUC 5) on day 1 and etoposide (100 mg/m2) on days 1, 2, 3 and Atezolizumab 1200 mg day 1 every 21 days x 4 cycles followed by maintenance Atezolizumab 1200 mg.  We discussed the side effects in detail including immunotherapy related side effects.  Would make a referral for port placement.  Given the urgent need of treatment, would start first cycle by placing a PICC line.  I plan to start treatment on 06/24/2017.

## 2017-06-19 NOTE — Progress Notes (Signed)
[facility] NOTE  Patient Care Team: Susy Frizzle, MD as PCP - General (Family Medicine)  CHIEF COMPLAINTS/PURPOSE OF CONSULTATION:  Newly diagnosed small cell lung cancer.  HISTORY OF PRESENTING ILLNESS:  Brandy Gray 77 y.o. female is seen in consultation today for further management of newly diagnosed small cell lung cancer.  She has been having upper respiratory symptoms with cough for the last 3 or 4 months.  Eventually she had a chest x-ray which showed a small right pleural effusion.  A CT scan was done on 05/31/2017 of the chest which showed bulky multilobular metastatic adenopathy invading the right mediastinum with infiltrative adenopathy extending the mediastinum which obliterates the right lower lobe bronchus.  Endobronchial extension at the level of the carina on the right was seen.  There was also probable left adrenal meta stasis and left hepatic lobe lesion.  She was seen by Dr. Luan Pulling and the underwent a bronchoscopy on 06/13/2017 which showed endobronchial lesion invading the bronchus just above the carina and a large endobronchial lesion in the right mainstem bronchus.  This was biopsied and consistent with small cell carcinoma.  She has a history of 22 pound weight loss in the past few months and has been smoking cigarettes for 64 years.  Cough is mostly nonproductive.  No GI symptoms like nausea, vomiting or diarrhea.  She worked in [employer] for 35 years.  Denies any hemoptysis.  No headaches or vision changes.  No nausea.  Occasional hurting in the epigastric region.  Drinks 1 boost per day.  Accompanied by her son and daughter-in-law today. -No family history of malignancies.  MEDICAL HISTORY:  Past Medical History:  Diagnosis Date  . Allergy   . Lung cancer [facility])     SURGICAL HISTORY: Past Surgical History:  Procedure Laterality Date  . BIOPSY  06/13/2017   Procedure: BIOPSY;  Surgeon: Sinda Du, MD;  Location[facility];  Service: Cardiopulmonary;;  right lung  . FLEXIBLE BRONCHOSCOPY N/A 06/13/2017   Procedure: FLEXIBLE BRONCHOSCOPY;  Surgeon: Sinda Du, MD;  Location: [facility];  Service: Cardiopulmonary;  Laterality: N/A;  . VAGINAL HYSTERECTOMY      SOCIAL HISTORY: Social History   Socioeconomic History  . Marital status: Widowed    Spouse name: Not on file  . Number of children: Not on file  . Years of education: Not on file  . Highest education level: Not on file  Occupational History  . Not on file  Social Needs  . Financial resource strain: Not on file  . Food insecurity:    Worry: Not on file    Inability: Not on file  . Transportation needs:    Medical: Not on file    Non-medical: Not on file  Tobacco Use  . Smoking status: Current Every Day Smoker    Packs/day: 0.50    Years: 64.00    Pack years: 32.00    Types: Cigarettes  . Smokeless tobacco: Never Used  Substance and Sexual Activity  . Alcohol use: Never    Frequency: Never  . Drug use: Never  . Sexual activity: Not Currently    Birth control/protection: Post-menopausal  Lifestyle  . Physical activity:    Days per week: Not on file    Minutes per session: Not on file  . Stress: Not on file  Relationships  . Social connections:    Talks on phone: Not on file    Gets together: Not on file  Attends religious service: Not on file    Active member of club or organization: Not on file    Attends meetings of clubs or organizations: Not on file    Relationship status: Not on file  . Intimate partner violence:    Fear of current or ex partner: Not on file    Emotionally abused: Not on file    Physically abused: Not on file    Forced sexual activity: Not on file  Other Topics Concern  . Not on file  Social History Narrative  . Not on file    FAMILY HISTORY: Family History  Problem Relation Age of Onset  . Skin cancer Mother   . Aneurysm Father   . Skin cancer Sister     ALLERGIES:  is allergic  to codeine.  MEDICATIONS:  Current Outpatient Medications  Medication Sig Dispense Refill  . lidocaine-prilocaine (EMLA) cream Apply a quarter size amount to port site 1 hour prior to chemo. Do not rub in. Cover with plastic wrap. 30 g 3  . prochlorperazine (COMPAZINE) 10 MG tablet Take 1 tablet (10 mg total) by mouth every 6 (six) hours as needed for nausea or vomiting. 30 tablet 3   No current facility-administered medications for this visit.     REVIEW OF SYSTEMS:   Constitutional: Denies fevers, chills or abnormal night sweats Eyes: Denies blurriness of vision, double vision or watery eyes Ears, nose, mouth, throat, and face: Denies mucositis or sore throat Respiratory: Denies cough, dyspnea or wheezes Cardiovascular: Denies palpitation, chest discomfort or lower extremity swelling Gastrointestinal:  Denies nausea, heartburn or change in bowel habits Skin: Denies abnormal skin rashes Lymphatics: Denies new lymphadenopathy or easy bruising Neurological:Denies numbness, tingling or new weaknesses Behavioral/Psych: Mood is stable, no new changes  All other systems were reviewed with the patient and are negative.  PHYSICAL EXAMINATION: ECOG PERFORMANCE STATUS: 2 - Symptomatic, <50% confined to bed I have reviewed her vitals.  Blood pressure is 96/52, pulse is 102, respiratory rate of 18, saturations are 95% on room air. GENERAL:alert, no distress and comfortable SKIN: skin color, texture, turgor are normal, no rashes or significant lesions EYES: normal, conjunctiva are pink and non-injected, sclera clear OROPHARYNX:no exudate, no erythema and lips, buccal mucosa, and tongue normal  NECK: supple, thyroid normal size, non-tender, without nodularity LYMPH:  no palpable lymphadenopathy in the cervical, axillary or inguinal LUNGS: clear to auscultation and percussion with normal breathing effort.  Decreased breath sounds on right base. HEART: regular rate & rhythm and no murmurs and no  lower extremity edema ABDOMEN:abdomen soft, non-tender and normal bowel sounds PSYCH: alert & oriented x 3 with fluent speech   LABORATORY DATA:  I have reviewed the data as listed Lab Results  Component Value Date   WBC 11.0 (H) 06/11/2017   HGB 15.2 (H) 06/11/2017   HCT 47.2 (H) 06/11/2017   MCV 95.7 06/11/2017   PLT 232 06/11/2017     Chemistry      Component Value Date/Time   NA 142 06/11/2017 1342   K 3.8 06/11/2017 1342   CL 103 06/11/2017 1342   CO2 26 06/11/2017 1342   BUN 52 (H) 06/11/2017 1342   CREATININE 1.50 (H) 06/11/2017 1342      Component Value Date/Time   CALCIUM 9.9 06/11/2017 1342       RADIOGRAPHIC STUDIES: I have personally reviewed the radiological images as listed and agreed with the findings in the report. Ct Chest W Contrast  Result Date: 05/31/2017 CLINICAL  DATA:  RIGHT hilar mass on chest radiograph EXAM: CT CHEST WITH CONTRAST TECHNIQUE: Multidetector CT imaging of the chest was performed during intravenous contrast administration. CONTRAST:  47m ISOVUE-300 IOPAMIDOL (ISOVUE-300) INJECTION 61% COMPARISON:  5239 FINDINGS: Cardiovascular: RIGHT main pulmonary artery is narrowed by the mediastinal mass. No acute pulmonary embolism identified. Aorta normal. Mediastinum/Nodes: No axillary or supraclavicular adenopathy. Bulky mass in the RIGHT mediastinum extends into the paratracheal nodal station, subcarinal nodal station and surrounding bronchus intermedius. This mass obliterates the bronchus intermedius (image 58/5). The portion the mass in the RIGHT hilum/RIGHT lower paratracheal region measures 3.5 x 3.0 cm. The RIGHT infrahilar portion of the mass which extends into the subcarinal location measures 4.9 x 4.7 cm. These two masses are contiguous with each other. Lungs/Pleura: There is postobstructive pneumonitis and collapse of the RIGHT lower lobe. There is a potential separate mass in the RIGHT lower lobe inferior to the hilar mass measuring 4.5 by  4.3 cm (image 81/2). Small RIGHT effusion. LEFT lung is hyperexpanded. No discrete lesion Endobronchial lesion in the RIGHT side of the carina (image 57/2 Upper Abdomen: There is heterogeneous enhancement pattern of the liver with a lobular contour more. Potential mass lesion in the central LEFT hepatic lobe measuring 4.4 cm x 9.0 cm (image 128/20. Enlargement of the LEFT adrenal gland to 13 mm Musculoskeletal: No aggressive osseous lesion IMPRESSION: 1. Bulky multilobular metastatic adenopathy invading the RIGHT mediastinum. Favor a primary pulmonary lesion within the collapsed RIGHT lower lobe. 2. Infiltrative adenopathy extending the mediastinum which obliterates the RIGHT lower lobe bronchus. Endobronchial extension at the level the carina on the RIGHT. 3. Lobular contour of the liver with heterogeneous enhancement and potential large central LEFT hepatic lobe lesion most consistent with hepatic metastasis 4. Probable LEFT adrenal metastasis. 5. Findings are suggestive of small cell bronchogenic carcinoma. If multidisciplinary follow up management is desired, this is available in [facility] [phone] These results will be called to the ordering clinician or representative by the Radiologist Assistant, and communication documented in the PACS or zVision Dashboard. Electronically Signed   By: SSuzy BouchardM.D.   On: 05/31/2017 14:46   [place]hest (ple[place] Effusion)  Result Date: 05/31/2017 CLINICAL DATA:  Abnormal chest radiograph, RIGHT pleural effusion EXAM: CHEST ULTRASOUND COMPARISON:  Chest radiograph 05/30/2017 FINDINGS: Sonography of the RIGHT pleural space performed in anticipation of thoracentesis. Only a small RIGHT pleural effusion is identified. Volume of RIGHT pleural fluid present is insufficient for thoracentesis. IMPRESSION: Insufficient RIGHT pleural effusion for thoracentesis. Discussed with Dr. HLuan Pulling thoracentesis will not be  performed and will instead proceed with already planned CT chest with contrast. Electronically Signed   By: MLavonia DanaM.D.   On: 05/31/2017 14:16    ASSESSMENT & PLAN:  Small cell lung cancer ([place] 1.  Extensive stage small cell lung cancer: - CT scan of the chest on 05/31/2017 shows bulky multilobular metastatic adenopathy invading the right mediastinum, collapsed right lower lobe, infiltrative adenopathy extending the mediastinum obliterating the right lower lobe bronchus, possible left hepatic metastasis, left adrenal metastasis. -Bronchoscopy on 06/13/2017 showing endobronchial lesion just above the carina and a large endobronchial lesion in the right mainstem bronchus, biopsy consistent with small cell lung cancer - Would order brain MRI with and without gadolinium for staging.  Also obtain CT scan of the abdomen and pelvis to complete staging. - We talked about the normal prognosis of extensive stage small cell lung cancer.  From the time  of diagnosis, median survival with treatment is around 12 months.  Less than 5% of the patient survive beyond 2 years.  Treatment is recommended in the palliative setting for symptom control and prolonging life.  Goals of care were clearly discussed.  Upon discussion, patient would like to try at least 1-2 cycles and see how she tolerates it. - Would recommend combination chemoimmunotherapy with carboplatin (AUC 5) on day 1 and etoposide (100 mg/m2) on days 1, 2, 3 and Atezolizumab 1200 mg day 1 every 21 days x 4 cycles followed by maintenance Atezolizumab 1200 mg.  We discussed the side effects in detail including immunotherapy related side effects.  Would make a referral for port placement.  Given the urgent need of treatment, would start first cycle by placing a PICC line.  I plan to start treatment on 06/24/2017.  Orders Placed This Encounter  Procedures  . MR Brain W Wo Contrast    Standing Status:   Future    Standing Expiration Date:   06/19/2018    Order  Specific Question:   If indicated for the ordered procedure, I authorize the administration of contrast media per Radiology protocol    Answer:   Yes    Order Specific Question:   What is the patient's sedation requirement?    Answer:   No Sedation    Order Specific Question:   Does the patient have a pacemaker or implanted devices?    Answer:   No    Order Specific Question:   Use SRS Protocol?    Answer:   No    Order Specific Question:   Radiology Contrast Protocol - do NOT remove file path    Answer:   \\[email or web address]    Order Specific Question:   Reason for Exam additional comments    Answer:   Staging for small cell lung cancer.    Order Specific Question:   Preferred imaging location?    Answer:   [facility] (table limit-350lbs)  . CT Abdomen Pelvis W Contrast    Standing Status:   Future    Standing Expiration Date:   06/19/2018    Order Specific Question:   If indicated for the ordered procedure, I authorize the administration of contrast media per Radiology protocol    Answer:   Yes    Order Specific Question:   Preferred imaging location?    Answer:   [facility]    Order Specific Question:   Is Oral Contrast requested for this exam?    Answer:   Yes, Per Radiology protocol    Order Specific Question:   Radiology Contrast Protocol - do NOT remove file path    Answer:   \\[email or web address]    Order Specific Question:   Reason for Exam additional comments    Answer:   Small cell lung cancer staging  . CBC with Differential    Standing Status:   Future    Standing Expiration Date:   06/19/2018  . Comprehensive metabolic panel    Standing Status:   Future    Standing Expiration Date:   06/19/2018  . Lactate dehydrogenase    Standing Status:   Future    Standing Expiration Date:   06/19/2018    All questions were answered. The patient knows to call the clinic with any problems, questions or concerns. Total  time spent is 60 minutes with more than 50% of the time spent face-to-face discussing new diagnosis, prognosis, treatment options and coordination of  care.    Derek Jack, MD 06/19/2017 6:07 PM

## 2017-06-19 NOTE — Patient Instructions (Addendum)
[place]   CHEMOTHERAPY INSTRUCTIONS  You have been diagnosed with extensive stage small cell lung cancer.  We are going to treat you with 4 cycles of carboplatin, etoposide and Atezolizumab/tecentriq.  Then you will continue to get tecentriq every 21 days until you either have progression or can no longer tolerate the drug. Carboplatin is given on day 1 every 21 days weeks and etoposide (VP-16) is given on day 1, day 2, and day 3 every 21 days.  Tecentriq is given day 1 every 21 days. This treatment is with palliative intent, which means you are treatable but not curable.   You will see the doctor regularly throughout treatment.  We monitor your lab work prior to every treatment.  The doctor monitors your response to treatment by the way you are feeling, your blood work, and scans periodically.  During your treatment there will be wait times.  It is about 30 minutes to 1 hour wait for lab work to result.  Then there is a wait time for pharmacy to prepare your medications.    You will receive the following pre-medications prior to receiving chemotherapy:  Premeds: Aloxi - high powered nausea/vomiting prevention medication used for chemotherapy patients. Dexamethasone - steroid - given to reduce the risk of you having an allergic type reaction to the chemotherapy. Dex can cause you to feel energized, nervous/anxious/jittery, make you have trouble sleeping, and/or make you feel hot/flushed in the face/neck and/or look pink/red in the face/neck. These side effects will pass as the Dex wears off. (takes 20 minutes to infuse)    You will also receive neulasta after you have finished chemotherapy. Neulasta/Neulasta On Pro - this medication is not chemo but being given because you have had chemo. This is given 24 to 27 hours after the completion of chemotherapy. This medication works by boosting your bone marrow's supply of white blood cells. White blood cells are  what protect our bodies against infection. The medication is given in the form of a subcutaneous injection. It is given in the fatty tissue of your abdomen. It is a short needle. The major side effect of this medication is bone or muscle pain. The drug of choice to relieve or lessen the pain is Aleve or Ibuprofen. If a physician has ever told you not to take Aleve or Ibuprofen - then don't take it. You should then take Tylenol/acetaminophen. Take either medication as the bottle directs you to.  The level of pain you experience as a result of this injection can range from none, to mild or moderate, or severe. Please let us know if you develop moderate or severe bone pain.   You can take Claritin 10 mg over the counter for a few days after receiving neulasta to help with the bone aches and pains.     **DO NOT expose the Neulasta On-body injector to diagnostic imaging (CT scans, MRI, Ultrasound, X-ray), radiation treatment, or oxygen rich environments, such as hyperbaric chambers. **    POTENTIAL SIDE EFFECTS OF TREATMENT:  Etoposide  Other Names: VePesid, VP-16  About This Drug Etoposide is a drug used to treat cancer. This drug is given in the vein (IV) or by mouth.  This drug will take one hour to infuse.    Possible Side Effects (More Common) . Nausea and throwing up (vomiting). These symptoms may happen within one to six hours after getting this drug and may last up to 72 hours. Medicines are available to  stop or lessen these side effects . Bone marrow depression. This is a decrease in the number of white blood cells, red blood cells, and platelets. This may raise your risk of infection, make you tired and weak (fatigue), and raise your risk of bleeding. . Decreased appetite (decreased hunger) . Hair loss. Most patients lost hair on their scalp and body. You may notice hair thinning five to seven days after getting this drug. Often hair loss is temporary; your hair should grow back when  treatment is done. . Fatigue  Possible Side Effects (Less Common) . Loose bowel movements (diarrhea) that may last for several days . Soreness of the mouth and throat. You may have red areas, white patches, or sores that hurt. . Increased total bilirubin in your blood. This may mean that you have changes in your liver function. Your blood work will be checked by your doctor. . Effects on the nerves are called peripheral neuropathy. You may feel numbness, tingling, or pain in your hands and feet. It may be hard for you to button your clothes, open jars, or walk as usual. The effect on the nerves may get worse with more doses of the drug. These effects get better in some people after the drug is stopped but it does not get better in all people. . Rash . Blood pressure may be low while getting this drug in an IV.  Allergic Reactions Serious allergic reactions including anaphylaxis are rare. While you are getting this drug in your vein (IV), tell your nurse right away if you have any of these symptoms of an allergic reaction: . trouble catching your breath . feeling like your tongue or throat are swelling . feeling your heart beat quickly or in a not normal way (palpitations) . feeling dizzy or lightheaded . flushing, itching, rash, and/or hives  Treating Side Effects . If you are getting this drug in an IV, tell your nurse knowright away if you are feeling lightheaded or dizzy. . Talk with your nurse about getting a wig before you lose your hair. Also, call the [facility] at 800-ACS-2345 to find out information about the "Look Good, Feel Better" program close to where you live. It is a free program where women getting chemotherapy can learn about wigs, turbans and scarves as well as makeup techniques and skin and nail care. . Drink 6-8 cups of fluids each day unless your doctor has told you to limit your fluid intake due to some other health problem. A cup is 8 ounces of fluid. If  you throw up or have loose bowel movements, you should drink more fluids so that you do not become dehydrated (lack water in the body from losing too much fluid). . Mouth care is very important. Your mouth care should consist of regular, gentle cleaning of your teeth or dentures and rinsing your mouth with a mixture of  teaspoon of salt in 8 ounces of water or  teaspoon of sodium bicarbonate (baking soda) in 8 ounces of water. This should be done at least after every meal and at bedtime. . If you have mouth sores, avoid mouthwash that contains alcohol. Also avoid alcohol and smoking because they can bother your mouth and throat. . If you get a rash do not put anything on it unless your doctor or nurse says you may. Keep the area around the rash clean and dry. Let your doctor know right away if you get a rash while on this medicine. Marland Kitchen  Ask your doctor or nurse about medicine that is available to help stop or lessen nausea or throwing up. . Be careful when cooking, walking, and handling sharp objects and hot liquids.  Food and Drug Interactions There are no known interactions of etoposide with food. This drug may interact with other medication. Tell your doctor and pharmacist about all the medication and dietary supplements (vitamins, minerals, herbs and others) that you are taking at this time. The safety and effectiveness of dietary supplements and alternative diets are often unknown. Using these might affect your cancer or interfere with your treatment. Until more is known, you should not use dietary supplements or alternative diets without your cancer doctor's help.    When to Call the Doctor Call your doctor or nurse right away if you have any of these symptoms: . Trouble breathing or feeling short of breath . Fever of 100.5 F (38 C) or higher . Chills . Easy bleeding or bruising . Rash or itching . Feeling dizzy or lightheaded . Feeling that your heart is beating in a fast or not normal  way (palpitations) . Loose bowel movements (diarrhea) more than 4 times a day or diarrhea with weakness or feeling lightheaded . Feeling confused . Nausea that stops you from eating or drinking . Throwing up more than 3 times a day Call your doctor or nurse as soon as possible if you have any of these symptoms: . Numbness, tingling, decreased feeling or weakness in fingers, toes, arms, or legs . Trouble walking or changes in the way you walk . Pain in your mouth or throat that makes it hard to eat or drink  Sexual Problems and Reproductive Concerns  . Women may go through signs of menopause (change of life) like vaginal dryness or itching. Vaginal lubricants can be used to lessen vaginal dryness, itching, and pain during sexual relations.    Carboplatin    About This Drug Carboplatin is a drug used to treat cancer. This drug is given in the vein (IV).  This drug will take 30 minutes to infuse.    Possible Side Effects (More Common) . Nausea and throwing up (vomiting). These symptoms may happen within a few hours after your treatment and may last up to 24 hours. Medicines are available to stop or lessen these side effects. . Bone marrow depression. This is a decrease in the number of white blood cells, red blood cells, and platelets. This may raise your risk of infection, make you tired and weak (fatigue), and raise your risk of bleeding. . Soreness of the mouth and throat. You may have red areas, white patches, or sores that hurt. . This drug may affect how your kidneys work. Your kidney function will be checked as needed. . Electrolyte changes. Your blood will be checked for electrolyte changes as needed.  Possible Side Effects (Less Common) . Hair loss. Some patients lose their hair on the scalp and body. You may notice your hair thinning seven to 14 days after getting this drug. . Effects on the nerves are called peripheral neuropathy. You may feel numbness, tingling, or pain  in your hands and feet. It may be hard for you to button your clothes, open jars, or walk as usual. The effect on the nerves may get worse with more doses of the drug. These effects get better in some people after the drug is stopped but it does not get better in all people. . Loose bowel movements (diarrhea) that may last for  several days . Decreased hearing or ringing in the ears . Changes in the way food and drinks taste . Changes in liver function. Your liver function will be checked as needed.  Allergic Reactions Serious allergic reactions including anaphylaxis are rare. While you are getting this drug in your vein (IV), tell your nurse right away if you have any of these symptoms of an allergic reaction: . Trouble catching your breath . Feeling like your tongue or throat are swelling . Feeling your heart beat quickly or in a not normal way (palpitations) . Feeling dizzy or lightheaded . Flushing, itching, rash, and/or hives Treating Side Effects . Drink 6-8 cups of fluids each day unless your doctor has told you to limit your fluid intake due to some other health problem. A cup is 8 ounces of fluid. If you throw up or have loose bowel movements, you should drink more fluids so that you do not become dehydrated (lack water in the body from losing too much fluid). . Mouth care is very important. Your mouth care should consist of routine, gentle cleaning of your teeth or dentures and rinsing your mouth with a mixture of 1/2 teaspoon of salt in 8 ounces of water or  teaspoon of baking soda in 8 ounces of water. This should be done at least after each meal and at bedtime. . If you have mouth sores, avoid mouthwash that has alcohol. Avoid alcohol and smoking because they can bother your mouth and throat. . If you have numbness and tingling in your hands and feet, be careful when cooking, walking, and handling sharp objects and hot liquids. . Talk with your nurse about getting a wig before you  lose your hair. Also, call the [facility] at 800-ACS-2345 to find out information about the "Look Good, Feel Better" program close to where you live. It is a free program where women getting chemotherapy can learn about wigs, turbans and scarves as well as makeup techniques and skin and nail care.  Food and Drug Interactions There are no known interactions of carboplatin with food. This drug may interact with other medicines. Tell your doctor and pharmacist about all the medicines and dietary supplements (vitamins, minerals, herbs and others) that you are taking at this time. The safety and use of dietary supplements and alternative diets are often not known. Using these might affect your cancer or interfere with your treatment. Until more is known, you should not use dietary supplements or alternative diets without your cancer doctor's help.  When to Call the Doctor Call your doctor or nurse right away if you have any of these symptoms: . Fever of 100.5 F (38 C) or above; chills . Bleeding or bruising that is not normal . Wheezing or trouble breathing . Nausea that stops you from eating or drinking . Throwing up more than once a day . Rash or itching . Loose bowel movements (diarrhea) more than four times a day or diarrhea with weakness or feeling lightheaded . Call your doctor or nurse as soon as possible if any of these symptoms happen: . Numbness, tingling, decreased feeling or weakness in fingers, toes, arms, or legs . Change in hearing, ringing in the ears . Blurred vision or other changes in eyesight . Decreased urine . Yellowing of skin or eyes  Sexual Problems and Reproductive Concerns Sexual problems and reproduction concerns may happen. In both men and women, this drug may affect your ability to have children. This cannot be determined before  your treatment. Talk with your doctor or nurse if you plan to have children. Ask for information on sperm or egg banking. In  men, this drug may interfere with your ability to make sperm, but it should not change your ability to have sexual relations. In women, menstrual bleeding may become irregular or stop while you are getting this drug. Do not assume that you cannot become pregnant if you do not have a menstrual period. Women may go through signs of menopause (change of life) like vaginal dryness or itching. Vaginal lubricants can be used to lessen vaginal dryness, itching, and pain during sexual relations. Genetic counseling is available for you to talk about the effects of this drug therapy on future pregnancies. Also, a genetic counselor can look at the possible risk of problems in the unborn baby due to this medicine if an exposure happens during pregnancy. . Pregnancy warning: This drug may have harmful effects on the unborn child, so effective methods of birth control should be used during your cancer treatment. . Breast feeding warning: It is not known if this drug passes into breast milk. For this reason, women should talk to their doctor about the risks and benefits of breast feeding during treatment with this drug because this drug may enter the breast milk and badly harm a breast feeding baby.   Atezolizumab Gildardo Pounds)  About This Drug Huey Bienenstock is used to treat cancer. It is given by the vein (IV).  This will take 1 hour to infuse the first time and then the second and subsequent infusions will take 30 minutes to infuse.  Possible Side Effects . Tiredness . Decreased appetite (decreased hunger) . Nausea . Constipation (not able to move bowels) . Loose bowel movements (diarrhea) . Urinary tract infection. Symptoms may include: . Pain or burning when you pass urine. . Feeling like you have to pass urine often, but not much comes out when you do. . Tender or heavy feeling in your lower abdomen. . Cloudy urine and/or urine that smells bad. . Pain on one side of your back under your ribs. This is  where your kidneys are. . Fever, chills, nausea and/or throwing up. . Fever . Cough and/or trouble breathing . Muscle, bone and joint pain Note: Each of the side effects above was reported in 20% or greater of patients treated with atezolizumab. Not all possible side effects are included above.  Warnings and Precautions . This drug works with your immune system and can cause inflammation in any of your organs and tissues and can change how they work. This may put you at risk for developing serious medical problems which can very rarely be fatal. . Inflammation (swelling) of the lungs which can very rarely be fatal. You may have a dry cough or trouble breathing. . Changes in your liver function. . Colitis. This is swelling (inflammation) in the colon - symptoms are loose bowel movements (diarrhea) stomach cramping, and sometimes blood in the bowel movements . Changes in your central nervous system can happen. The central nervous system is made up of your brain and spinal cord. You could feel extreme tiredness, agitation, confusion, hallucinations (see or hear things that are not there), trouble understanding or speaking, loss of control of your bowels or bladder, eyesight changes, numbness or lack of strength to your arms, legs, face, or body, and coma. If you start to have any of these symptoms let your doctor know right away. . This drug may affect some of your hormone  glands (especially the thyroid, adrenals, pituitary and pancreas). . Blood sugar levels may change and you may develop diabetes. If you already have diabetes, changes may need to be made to your diabetes medication. . Inflammation of your pancreas. . Inflammation of your eyes and/or other changes in eyesight . Severe infections, including viral, bacterial and fungal, which can very rarely be fatal . While you are getting this drug in your vein (IV), you may have a reaction to the drug. Sometimes you may be given medication to stop  or lessen these side effects. Your nurse will check you closely for these signs: fever or shaking chills, flushing, facial swelling, feeling dizzy, headache, trouble breathing, rash, itching, chest tightness, or chest pain. These reactions may happen after your infusion. If this happens, call 911 for emergency care.  Important Information . This drug may be present in the saliva, tears, sweat, urine, stool, vomit, semen, and vaginal secretions. Talk to your doctor and/or your nurse about the necessary precautions to take during this time.  Treating Side Effects . Drink plenty of fluids (a minimum of eight glasses per day is recommended). . To help with decreased appetite, eat small, frequent meals . Eat high caloric food such as pudding, ice cream, yogurt and milkshakes. . Ask your doctor or nurse about medicine that is available to help stop or lessen the loose bowel movements, nausea and/or constipation. . If you are not able to move your bowels, check with your doctor or nurse before you use enemas, laxatives, or suppositories . If you throw up or have loose bowel movements, you should drink more fluids so that you do not become dehydrated (lack water in the body from losing too much fluid). . To help with nausea and vomiting, eat small, frequent meals instead of three large meals a day. Choose foods and drinks that are at room temperature. Ask your nurse or doctor about other helpful tips and medicine that is available to help or stop lessen these symptoms. . If you get diarrhea, eat low-fiber foods that are high in protein and calories and avoid foods that can irritate your digestive tracts or lead to cramping. . Manage tiredness by pacing your activities for the day. Be sure to include periods of rest between energy-draining activities . If you're diabetic, keep good control of your blood sugar level. Tell your nurse or your doctor if your glucose levels are higher or lower than normal .  Keeping your pain under control is important to your well-being. Please tell your doctor or nurse if you are experiencing pain. . Infusion reactions may happen for 24 hours after your infusion. If this happens, call 911 for emergency care.  Food and Drug Interactions . There are no known interactions of atezolizumab with food. . This drug may interact with other medicines. Tell your doctor and pharmacist about all the medicines and dietary supplements (vitamins, minerals, herbs and others) that you are taking at this time. The safety and use of dietary supplements and alternative diets are often not known. Using these might affect your cancer or interfere with your treatment. Until more is known, you should not use dietary supplements or alternative diets without your cancer doctor's help.  When to Call the Doctor Call your doctor or nurse if you have any of these symptoms and/or any new or unusual symptoms: . Fever of 100.5 F (38 C) or higher . Chills . Pain in your chest . Dry cough . Trouble breathing . Confusion and/or  agitation . Hallucinations . Trouble understanding or speaking . Blurry vision or changes in your eyesight . Numbness or lack of strength to your arms, legs, face, or body . Blurred vision or other changes in eyesight . Loose bowel movements (diarrhea) 4 times or loose bowel movements with lack of strength or a feeling of being dizzy . Pain in your abdomen that does not go away . Blood in your stool . No bowel movement in 3 days or when you feel uncomfortable . Nausea that stops you from eating or drinking and/or is not relieved by prescribed medicines . Throwing up more than 3 times a day . Lasting loss of appetite or rapid weight loss of five pounds in a week . Pain or burning when you pass urine. . Difficulty urinating . Feeling like you have to pass urine often, but not much comes out when you do. . Tender or heavy feeling in your lower abdomen. . Cloudy urine  and/or urine that smells bad. . Pain on one side of your back under your ribs. This is where your kidneys are. . Abnormal blood sugar . Unusual thirst, passing urine often, headache, sweating, shakiness, irritability . Pain that does not go away, or is not relieved by prescribed medicines . Fatigue that interferes with your daily activities . Signs of infusion reaction: fever or shaking chills, flushing, facial swelling, feeling dizzy, headache, trouble breathing, rash, itching, chest tightness, or chest pain. . Signs of possible liver problems: dark urine, pale bowel movements, bad stomach pain, feeling very tired and weak, unusual itching, or yellowing of the eyes or skin . If you think you may be pregnant        SELF CARE ACTIVITIES WHILE ON CHEMOTHERAPY: Hydration Increase your fluid intake 48 hours prior to treatment and drink at least 8 to 12 cups (64 ounces) of water/decaff beverages per day after treatment. You can still have your cup of coffee or soda but these beverages do not count as part of your 8 to 12 cups that you need to drink daily. No alcohol intake.  Medications Continue taking your normal prescription medication as prescribed.  If you start any new herbal or new supplements please let us know first to make sure it is safe.  Mouth Care Have teeth cleaned professionally before starting treatment. Keep dentures and partial plates clean. Use soft toothbrush and do not use mouthwashes that contain alcohol. Biotene is a good mouthwash that is available at most pharmacies or may be ordered by calling [phone]. Use warm salt water gargles (1 teaspoon salt per 1 quart warm water) before and after meals and at bedtime. Or you may rinse with 2 tablespoons of three-percent hydrogen peroxide mixed in eight ounces of water. If you are still having problems with your mouth or sores in your mouth please call the clinic. If you need dental work, please let the doctor know  before you go for your appointment so that we can coordinate the best possible time for you in regards to your chemo regimen. You need to also let your dentist know that you are actively taking chemo. We may need to do labs prior to your dental appointment.  Skin Care Always use sunscreen that has not expired and with SPF (Sun Protection Factor) of 50 or higher. Wear hats to protect your head from the sun. Remember to use sunscreen on your hands, ears, face, & feet.  Use good moisturizing lotions such as udder cream, eucerin, or even  Vaseline. Some chemotherapies can cause dry skin, color changes in your skin and nails.     Avoid long, hot showers or baths.  Use gentle, fragrance-free soaps and laundry detergent.  Use moisturizers, preferably creams or ointments rather than lotions because the thicker consistency is better at preventing skin dehydration. Apply the cream or ointment within 15 minutes of showering. Reapply moisturizer at night, and moisturize your hands every time after you wash them.  Hair Loss (if your doctor says your hair will fall out)   If your doctor says that your hair is likely to fall out, decide before you begin chemo whether you want to wear a wig. You may want to shop before treatment to match your hair color.  Hats, turbans, and scarves can also camouflage hair loss, although some people prefer to leave their heads uncovered. If you go bare-headed outdoors, be sure to use sunscreen on your scalp.  Cut your hair short. It eases the inconvenience of shedding lots of hair, but it also can reduce the emotional impact of watching your hair fall out.  Don't perm or color your hair during chemotherapy. Those chemical treatments are already damaging to hair and can enhance hair loss. Once your chemo treatments are done and your hair has grown back, it's OK to resume dyeing or perming hair. With chemotherapy, hair loss is almost always temporary. But when it grows back,  it may be a different color or texture. In older adults who still had hair color before chemotherapy, the new growth may be completely gray.  Often, new hair is very fine and soft.  Infection Prevention Please wash your hands for at least 30 seconds using warm soapy water. Handwashing is the #1 way to prevent the spread of germs. Stay away from sick people or people who are getting over a cold. If you develop respiratory systems such as green/yellow mucus production or productive cough or persistent cough let us know and we will see if you need an antibiotic. It is a good idea to keep a pair of gloves on when going into grocery stores/[employer] to decrease your risk of coming into contact with germs on the carts, etc. Carry alcohol hand gel with you at all times and use it frequently if out in public. If your temperature reaches 100.5 or higher please call the clinic and let us know.  If it is after hours or on the weekend please go to the ER if your temperature is over 100.5.  Please have your own personal thermometer at home to use.    Sex and bodily fluids If you are going to have sex, a condom must be used to protect the person that isn't taking chemotherapy. Chemo can decrease your libido (sex drive). For a few days after chemotherapy, chemotherapy can be excreted through your bodily fluids.  When using the toilet please close the lid and flush the toilet twice.  Do this for a few day after you have had chemotherapy.   Effects of chemotherapy on your sex life Some changes are simple and won't last long. They won't affect your sex life permanently. Sometimes you may feel:  too tired  not strong enough to be very active  sick or sore   not in the mood  anxious or low Your anxiety might not seem related to sex. For example, you may be worried about the cancer and how your treatment is going. Or you may be worried about money, or about how you  family are coping with your illness. These things  can cause stress, which can affect your interest in sex. It's important to talk to your partner about how you feel. Remember - the changes to your sex life don't usually last long. There's usually no medical reason to stop having sex during chemo. The drugs won't have any long term physical effects on your performance or enjoyment of sex. Cancer can't be passed on to your partner during sex  Contraception It's important to use reliable contraception during treatment. Avoid getting pregnant while you or your partner are having chemotherapy. This isbecause the drugs may harm the baby. Sometimes chemotherapy drugs can leave a man or woman infertile.  This means you would not be able to have children in the future. You might want to talk to someone about permanent infertility. It can be very difficult to learn that you may no longer be able to have children. Some people find counselling helpful. There might be ways to preserve your fertility, although this is easier for men than for women. You may want to speak to a fertility expert. You can talk about sperm banking or harvesting your eggs. You can also ask about other fertility options, such as donor eggs. If you haveor have had breast cancer, your doctor might advise you not to take the contraceptive pill. This is because the hormones in it might affect the cancer. It is not known for sure whether or not chemotherapy drugs can be passed on through semen or secretions from the vagina. Because of this some doctors advise people to use a barrier method if you have sex during treatment. This applies to vaginal, anal or oral sex. Generally, doctors advise a barrier method only for the time you are actually having the treatment and for about a week after your treatment. Advice like this can be worrying, but this does not mean that you have to avoid being intimate with your partner. You can still have close contact with your partner and continue to enjoy  sex.  Animals If you have cats or birds we just ask that you not change the litter or change the cage.  Please have someone else do this for you while you are on chemotherapy.   Food Safety During and After Cancer Treatment Food safety is important for people both during and after cancer treatment. Cancer and cancer treatments, such as chemotherapy, radiation therapy, and stem cell/bone marrow transplantation, often weaken the immune system. This makes it harder for your body to protect itself from foodborne illness, also called food poisoning. Foodborne illness is caused by eating food that contains harmful bacteria, parasites, or viruses.  Foods to avoid Some foods have a higher risk of becoming tainted with bacteria. These include:  Unwashed fresh fruit and vegetables, especially leafy vegetables that can hide dirt and other contaminants  Raw sprouts, such as alfalfa sprouts  Raw or undercooked beef, especially ground beef, or other raw or undercooked meat and poultry  Fatty, fried, or spicy foods immediately before or after treatment.  These can sit heavy on your stomach and make you feel nauseous.  Raw or undercooked shellfish, such as oysters.  Sushi and sashimi, which often contain raw fish.   Unpasteurized beverages, such as unpasteurized fruit juices, raw milk, raw yogurt, or cider  Undercooked eggs, such as soft boiled, over easy, and poached; raw, unpasteurized eggs; or foods made with raw egg, such as homemade raw cookie dough and homemade mayonnaise Simple steps for food  Education officer, museum.  Do not buy food stored or displayed in an unclean area.  Do not buy bruised or damaged fruits or vegetables.  Do not buy cans that have cracks, dents, or bulges.  Pick up foods that can spoil at the end of your shopping trip and store them in a cooler on the way home. Prepare and clean up foods carefully.  Rinse all fresh fruits and vegetables under running water, and dry them  with a clean towel or paper towel.  Clean the top of cans before opening them.  After preparing food, wash your hands for 20 seconds with hot water and soap. Pay special attention to areas between fingers and under nails.  Clean your utensils and dishes with hot water and soap.  Disinfect your kitchen and cutting boards using 1 teaspoon of liquid, unscented bleach mixed into 1 quart of water.  Dispose of old food.  Eat canned and packaged food before its expiration date (the "use by" or "best before" date).  Consume refrigerated leftovers within 3 to 4 days. After that time, throw out the food. Even if the food does not smell or look spoiled, it still may be unsafe. Some bacteria, such as Listeria, can grow even on foods stored in the refrigerator if they are kept for too long.   Take precautions when eating out.  At restaurants, avoid buffets and salad bars where food sits out for a long time and comes in contact with many people. Food can become contaminated when someone with a virus, often a norovirus, or another "bug" handles it.  Put any leftover food in a "to-go" container yourself, rather than having the server do it. And, refrigerate leftovers as soon as you get home.  Choose restaurants that are clean and that are willing to prepare your food as you order it cooked.    MEDICATIONS:    Compazine/Prochlorperazine 10mg  tablet. Take 1 tablet every 6 hours as needed for nausea/vomiting. (can make you sleepy)  Over-the-Counter Meds:  Colace 100mg  capsule. Take 2 capsules daily for constipation/hard stool. If no relief, you may take 2 capsules in the morning and 2 capsules in the evening. If no relief, call [facility] and we will call in something to your drug store.   Imodium 2mg  capsule. Take 2 capsules after the 1st loose stool and then 1 capsule after each loose stool but do not exceed 8 capsules in a 24 hour period. Call [facility] if loose stools  continue. If diarrhea occurs @ bedtime, take 2 capsules @ bedtime. Then take 2 capsules every 4 hours until morning. Call [facility].  Constipation Sheet  Imodium 2mg  capsule. Take 2 capsules after the 1st loose stool and then 1 capsule after each loose stool but do not exceed 8 capsules in a 24 hour period. Call [facility] if loose stools continue. If diarrhea occurs @ bedtime, take 2 capsules @ bedtime. Then take 2 capsules every 4 hours until morning. Call [facility].  Please call if the above does not work for you.   Do not go more than 2 days without a bowel movement.  It is very important that you do not become constipated.  It will make you feel sick to your stomach (nausea) and can cause abdominal pain and vomiting.    Diarrhea Sheet  If you are having loose stools/diarrhea, please purchase Imodium and begin taking as outlined:   Imodium 2mg  capsule. Take 2 capsules after the 1st  loose stool and then 1 capsule after each loose stool but do not exceed 8 capsules in a 24 hour period. Call [facility] if loose stools continue. If diarrhea occurs @ bedtime, take 2 capsules @ bedtime. Then take 2 capsules every 4 hours until morning. Call [facility].  Always call the [facility] if you are having loose stools/diarrhea that you can't get under control.  Loose stools/disrrhea leads to dehydration (loss of water) in your body.  We have other options of trying to get the loose stools/diarrhea to stopped but you must let us know!  Nausea Sheet   Compazine/Prochlorperazine 10mg  tablet. Take 1 tablet every 6 hours as needed for nausea/vomiting. (can make you sleepy)  Please call the [facility] and let us know the amount of nausea that you are experiencing.  If you begin to vomit, you need to call the [facility] and if it is the weekend and you have vomited more than one time and cant get it to stop-go to the Emergency Room.  Persistent nausea/vomiting can lead to  dehydration (loss of fluid in your body) and will make you feel terrible.   Ice chips, sips of clear liquids, foods that are @ room temperature, crackers, and toast tend to be better tolerated.    SYMPTOMS TO REPORT AS SOON AS POSSIBLE AFTER TREATMENT:  FEVER GREATER THAN 100.5 F  CHILLS WITH OR WITHOUT FEVER  NAUSEA AND VOMITING THAT IS NOT CONTROLLED WITH YOUR NAUSEA MEDICATION  UNUSUAL SHORTNESS OF BREATH  UNUSUAL BRUISING OR BLEEDING  TENDERNESS IN MOUTH AND THROAT WITH OR WITHOUT PRESENCE OF ULCERS  URINARY PROBLEMS  BOWEL PROBLEMS  UNUSUAL RASH    Wear comfortable clothing and clothing appropriate for easy access to any Portacath or PICC line. Let us know if there is anything that we can do to make your therapy better!    What to do if you need assistance after hours or on the weekends: CALL [phone].  HOLD on the line, do not hang up.  You will hear multiple messages but at the end you will be connected with a nurse triage line.  They will contact the doctor if necessary.  Most of the time they will be able to assist you.  Do not call the hospital operator.     I have been informed and understand all of the instructions given to me and have received a copy. I have been instructed to call the clinic [phone] or my family physician as soon as possible for continued medical care, if indicated. I do not have any more questions at this time but understand that I may call the [facility] or the Patient Navigator at [phone] during office hours should I have questions or need assistance in obtaining follow-up care.

## 2017-06-19 NOTE — Patient Instructions (Signed)
[place]   CHEMOTHERAPY INSTRUCTIONS  You have been diagnosed with extensive stage small cell lung cancer.  We are going to treat you with 4 cycles of carboplatin, etoposide and Atezolizumab/tecentriq.  Then you will continue to get tecentriq every 21 days until you either have progression or can no longer tolerate the drug. Carboplatin is given on day 1 every 21 days weeks and etoposide (VP-16) is given on day 1, day 2, and day 3 every 21 days.  Tecentriq is given day 1 every 21 days. This treatment is with palliative intent, which means you are treatable but not curable. You will see the doctor regularly throughout treatment.  We monitor your lab work prior to every treatment.  The doctor monitors your response to treatment by the way you are feeling, your blood work, and scans periodically.  During your treatment there will be wait times.  It is about 30 minutes to 1 hour wait for lab work to result.  Then there is a wait time for pharmacy to prepare your medications.    You will receive the following pre-medications prior to receiving chemotherapy:  Premeds: Aloxi - high powered nausea/vomiting prevention medication used for chemotherapy patients. Dexamethasone - steroid - given to reduce the risk of you having an allergic type reaction to the chemotherapy. Dex can cause you to feel energized, nervous/anxious/jittery, make you have trouble sleeping, and/or make you feel hot/flushed in the face/neck and/or look pink/red in the face/neck. These side effects will pass as the Dex wears off. (takes 20 minutes to infuse)    You will also receive neulasta after you have finished chemotherapy. Neulasta/Neulasta On Pro - this medication is not chemo but being given because you have had chemo. This is given 24 to 27 hours after the completion of chemotherapy. This medication works by boosting your bone marrow's supply of white blood cells. White blood cells are what  protect our bodies against infection. The medication is given in the form of a subcutaneous injection. It is given in the fatty tissue of your abdomen. It is a short needle. The major side effect of this medication is bone or muscle pain. The drug of choice to relieve or lessen the pain is Aleve or Ibuprofen. If a physician has ever told you not to take Aleve or Ibuprofen - then don't take it. You should then take Tylenol/acetaminophen. Take either medication as the bottle directs you to.  The level of pain you experience as a result of this injection can range from none, to mild or moderate, or severe. Please let us know if you develop moderate or severe bone pain.   You can take Claritin 10 mg over the counter for a few days after receiving neulasta to help with the bone aches and pains.     **DO NOT expose the Neulasta On-body injector to diagnostic imaging (CT scans, MRI, Ultrasound, X-ray), radiation treatment, or oxygen rich environments, such as hyperbaric chambers. **    POTENTIAL SIDE EFFECTS OF TREATMENT:  Etoposide  Other Names: VePesid, VP-16  About This Drug Etoposide is a drug used to treat cancer. This drug is given in the vein (IV) or by mouth.  This drug will take one hour to infuse.    Possible Side Effects (More Common) . Nausea and throwing up (vomiting). These symptoms may happen within one to six hours after getting this drug and may last up to 72 hours. Medicines are available to stop or  lessen these side effects . Bone marrow depression. This is a decrease in the number of white blood cells, red blood cells, and platelets. This may raise your risk of infection, make you tired and weak (fatigue), and raise your risk of bleeding. . Decreased appetite (decreased hunger) . Hair loss. Most patients lost hair on their scalp and body. You may notice hair thinning five to seven days after getting this drug. Often hair loss is temporary; your hair should grow back when  treatment is done. . Fatigue  Possible Side Effects (Less Common) . Loose bowel movements (diarrhea) that may last for several days . Soreness of the mouth and throat. You may have red areas, white patches, or sores that hurt. . Increased total bilirubin in your blood. This may mean that you have changes in your liver function. Your blood work will be checked by your doctor. . Effects on the nerves are called peripheral neuropathy. You may feel numbness, tingling, or pain in your hands and feet. It may be hard for you to button your clothes, open jars, or walk as usual. The effect on the nerves may get worse with more doses of the drug. These effects get better in some people after the drug is stopped but it does not get better in all people. . Rash . Blood pressure may be low while getting this drug in an IV.  Allergic Reactions Serious allergic reactions including anaphylaxis are rare. While you are getting this drug in your vein (IV), tell your nurse right away if you have any of these symptoms of an allergic reaction: . trouble catching your breath . feeling like your tongue or throat are swelling . feeling your heart beat quickly or in a not normal way (palpitations) . feeling dizzy or lightheaded . flushing, itching, rash, and/or hives  Treating Side Effects . If you are getting this drug in an IV, tell your nurse knowright away if you are feeling lightheaded or dizzy. . Talk with your nurse about getting a wig before you lose your hair. Also, call the [facility] at 800-ACS-2345 to find out information about the "Look Good, Feel Better" program close to where you live. It is a free program where women getting chemotherapy can learn about wigs, turbans and scarves as well as makeup techniques and skin and nail care. . Drink 6-8 cups of fluids each day unless your doctor has told you to limit your fluid intake due to some other health problem. A cup is 8 ounces of fluid. If  you throw up or have loose bowel movements, you should drink more fluids so that you do not become dehydrated (lack water in the body from losing too much fluid). . Mouth care is very important. Your mouth care should consist of regular, gentle cleaning of your teeth or dentures and rinsing your mouth with a mixture of  teaspoon of salt in 8 ounces of water or  teaspoon of sodium bicarbonate (baking soda) in 8 ounces of water. This should be done at least after every meal and at bedtime. . If you have mouth sores, avoid mouthwash that contains alcohol. Also avoid alcohol and smoking because they can bother your mouth and throat. . If you get a rash do not put anything on it unless your doctor or nurse says you may. Keep the area around the rash clean and dry. Let your doctor know right away if you get a rash while on this medicine. . Ask your  doctor or nurse about medicine that is available to help stop or lessen nausea or throwing up. . Be careful when cooking, walking, and handling sharp objects and hot liquids.  Food and Drug Interactions There are no known interactions of etoposide with food. This drug may interact with other medication. Tell your doctor and pharmacist about all the medication and dietary supplements (vitamins, minerals, herbs and others) that you are taking at this time. The safety and effectiveness of dietary supplements and alternative diets are often unknown. Using these might affect your cancer or interfere with your treatment. Until more is known, you should not use dietary supplements or alternative diets without your cancer doctor's help.    When to Call the Doctor Call your doctor or nurse right away if you have any of these symptoms: . Trouble breathing or feeling short of breath . Fever of 100.5 F (38 C) or higher . Chills . Easy bleeding or bruising . Rash or itching . Feeling dizzy or lightheaded . Feeling that your heart is beating in a fast or not normal  way (palpitations) . Loose bowel movements (diarrhea) more than 4 times a day or diarrhea with weakness or feeling lightheaded . Feeling confused . Nausea that stops you from eating or drinking . Throwing up more than 3 times a day Call your doctor or nurse as soon as possible if you have any of these symptoms: . Numbness, tingling, decreased feeling or weakness in fingers, toes, arms, or legs . Trouble walking or changes in the way you walk . Pain in your mouth or throat that makes it hard to eat or drink  Sexual Problems and Reproductive Concerns  . Women may go through signs of menopause (change of life) like vaginal dryness or itching. Vaginal lubricants can be used to lessen vaginal dryness, itching, and pain during sexual relations.    Carboplatin    About This Drug Carboplatin is a drug used to treat cancer. This drug is given in the vein (IV).  This drug will take 30 minutes to infuse.    Possible Side Effects (More Common) . Nausea and throwing up (vomiting). These symptoms may happen within a few hours after your treatment and may last up to 24 hours. Medicines are available to stop or lessen these side effects. . Bone marrow depression. This is a decrease in the number of white blood cells, red blood cells, and platelets. This may raise your risk of infection, make you tired and weak (fatigue), and raise your risk of bleeding. . Soreness of the mouth and throat. You may have red areas, white patches, or sores that hurt. . This drug may affect how your kidneys work. Your kidney function will be checked as needed. . Electrolyte changes. Your blood will be checked for electrolyte changes as needed.  Possible Side Effects (Less Common) . Hair loss. Some patients lose their hair on the scalp and body. You may notice your hair thinning seven to 14 days after getting this drug. . Effects on the nerves are called peripheral neuropathy. You may feel numbness, tingling, or pain  in your hands and feet. It may be hard for you to button your clothes, open jars, or walk as usual. The effect on the nerves may get worse with more doses of the drug. These effects get better in some people after the drug is stopped but it does not get better in all people. . Loose bowel movements (diarrhea) that may last for several days .  Decreased hearing or ringing in the ears . Changes in the way food and drinks taste . Changes in liver function. Your liver function will be checked as needed.  Allergic Reactions Serious allergic reactions including anaphylaxis are rare. While you are getting this drug in your vein (IV), tell your nurse right away if you have any of these symptoms of an allergic reaction: . Trouble catching your breath . Feeling like your tongue or throat are swelling . Feeling your heart beat quickly or in a not normal way (palpitations) . Feeling dizzy or lightheaded . Flushing, itching, rash, and/or hives Treating Side Effects . Drink 6-8 cups of fluids each day unless your doctor has told you to limit your fluid intake due to some other health problem. A cup is 8 ounces of fluid. If you throw up or have loose bowel movements, you should drink more fluids so that you do not become dehydrated (lack water in the body from losing too much fluid). . Mouth care is very important. Your mouth care should consist of routine, gentle cleaning of your teeth or dentures and rinsing your mouth with a mixture of 1/2 teaspoon of salt in 8 ounces of water or  teaspoon of baking soda in 8 ounces of water. This should be done at least after each meal and at bedtime. . If you have mouth sores, avoid mouthwash that has alcohol. Avoid alcohol and smoking because they can bother your mouth and throat. . If you have numbness and tingling in your hands and feet, be careful when cooking, walking, and handling sharp objects and hot liquids. . Talk with your nurse about getting a wig before you  lose your hair. Also, call the [facility] at 800-ACS-2345 to find out information about the "Look Good, Feel Better" program close to where you live. It is a free program where women getting chemotherapy can learn about wigs, turbans and scarves as well as makeup techniques and skin and nail care.  Food and Drug Interactions There are no known interactions of carboplatin with food. This drug may interact with other medicines. Tell your doctor and pharmacist about all the medicines and dietary supplements (vitamins, minerals, herbs and others) that you are taking at this time. The safety and use of dietary supplements and alternative diets are often not known. Using these might affect your cancer or interfere with your treatment. Until more is known, you should not use dietary supplements or alternative diets without your cancer doctor's help.  When to Call the Doctor Call your doctor or nurse right away if you have any of these symptoms: . Fever of 100.5 F (38 C) or above; chills . Bleeding or bruising that is not normal . Wheezing or trouble breathing . Nausea that stops you from eating or drinking . Throwing up more than once a day . Rash or itching . Loose bowel movements (diarrhea) more than four times a day or diarrhea with weakness or feeling lightheaded . Call your doctor or nurse as soon as possible if any of these symptoms happen: . Numbness, tingling, decreased feeling or weakness in fingers, toes, arms, or legs . Change in hearing, ringing in the ears . Blurred vision or other changes in eyesight . Decreased urine . Yellowing of skin or eyes  Sexual Problems and Reproductive Concerns Sexual problems and reproduction concerns may happen. In both men and women, this drug may affect your ability to have children. This cannot be determined before your treatment. Talk  with your doctor or nurse if you plan to have children. Ask for information on sperm or egg banking. In  men, this drug may interfere with your ability to make sperm, but it should not change your ability to have sexual relations. In women, menstrual bleeding may become irregular or stop while you are getting this drug. Do not assume that you cannot become pregnant if you do not have a menstrual period. Women may go through signs of menopause (change of life) like vaginal dryness or itching. Vaginal lubricants can be used to lessen vaginal dryness, itching, and pain during sexual relations. Genetic counseling is available for you to talk about the effects of this drug therapy on future pregnancies. Also, a genetic counselor can look at the possible risk of problems in the unborn baby due to this medicine if an exposure happens during pregnancy. . Pregnancy warning: This drug may have harmful effects on the unborn child, so effective methods of birth control should be used during your cancer treatment. . Breast feeding warning: It is not known if this drug passes into breast milk. For this reason, women should talk to their doctor about the risks and benefits of breast feeding during treatment with this drug because this drug may enter the breast milk and badly harm a breast feeding baby.   Atezolizumab Gildardo Pounds)  About This Drug Huey Bienenstock is used to treat cancer. It is given by the vein (IV).  This will take 1 hour to infuse the first time and then the second and subsequent infusions will take 30 minutes to infuse.  Possible Side Effects . Tiredness . Decreased appetite (decreased hunger) . Nausea . Constipation (not able to move bowels) . Loose bowel movements (diarrhea) . Urinary tract infection. Symptoms may include: . Pain or burning when you pass urine. . Feeling like you have to pass urine often, but not much comes out when you do. . Tender or heavy feeling in your lower abdomen. . Cloudy urine and/or urine that smells bad. . Pain on one side of your back under your ribs. This is  where your kidneys are. . Fever, chills, nausea and/or throwing up. . Fever . Cough and/or trouble breathing . Muscle, bone and joint pain Note: Each of the side effects above was reported in 20% or greater of patients treated with atezolizumab. Not all possible side effects are included above.  Warnings and Precautions . This drug works with your immune system and can cause inflammation in any of your organs and tissues and can change how they work. This may put you at risk for developing serious medical problems which can very rarely be fatal. . Inflammation (swelling) of the lungs which can very rarely be fatal. You may have a dry cough or trouble breathing. . Changes in your liver function. . Colitis. This is swelling (inflammation) in the colon - symptoms are loose bowel movements (diarrhea) stomach cramping, and sometimes blood in the bowel movements . Changes in your central nervous system can happen. The central nervous system is made up of your brain and spinal cord. You could feel extreme tiredness, agitation, confusion, hallucinations (see or hear things that are not there), trouble understanding or speaking, loss of control of your bowels or bladder, eyesight changes, numbness or lack of strength to your arms, legs, face, or body, and coma. If you start to have any of these symptoms let your doctor know right away. . This drug may affect some of your hormone glands (especially the  thyroid, adrenals, pituitary and pancreas). . Blood sugar levels may change and you may develop diabetes. If you already have diabetes, changes may need to be made to your diabetes medication. . Inflammation of your pancreas. . Inflammation of your eyes and/or other changes in eyesight . Severe infections, including viral, bacterial and fungal, which can very rarely be fatal . While you are getting this drug in your vein (IV), you may have a reaction to the drug. Sometimes you may be given medication to stop  or lessen these side effects. Your nurse will check you closely for these signs: fever or shaking chills, flushing, facial swelling, feeling dizzy, headache, trouble breathing, rash, itching, chest tightness, or chest pain. These reactions may happen after your infusion. If this happens, call 911 for emergency care.  Important Information . This drug may be present in the saliva, tears, sweat, urine, stool, vomit, semen, and vaginal secretions. Talk to your doctor and/or your nurse about the necessary precautions to take during this time.  Treating Side Effects . Drink plenty of fluids (a minimum of eight glasses per day is recommended). . To help with decreased appetite, eat small, frequent meals . Eat high caloric food such as pudding, ice cream, yogurt and milkshakes. . Ask your doctor or nurse about medicine that is available to help stop or lessen the loose bowel movements, nausea and/or constipation. . If you are not able to move your bowels, check with your doctor or nurse before you use enemas, laxatives, or suppositories . If you throw up or have loose bowel movements, you should drink more fluids so that you do not become dehydrated (lack water in the body from losing too much fluid). . To help with nausea and vomiting, eat small, frequent meals instead of three large meals a day. Choose foods and drinks that are at room temperature. Ask your nurse or doctor about other helpful tips and medicine that is available to help or stop lessen these symptoms. . If you get diarrhea, eat low-fiber foods that are high in protein and calories and avoid foods that can irritate your digestive tracts or lead to cramping. . Manage tiredness by pacing your activities for the day. Be sure to include periods of rest between energy-draining activities . If you're diabetic, keep good control of your blood sugar level. Tell your nurse or your doctor if your glucose levels are higher or lower than normal .  Keeping your pain under control is important to your well-being. Please tell your doctor or nurse if you are experiencing pain. . Infusion reactions may happen for 24 hours after your infusion. If this happens, call 911 for emergency care.  Food and Drug Interactions . There are no known interactions of atezolizumab with food. . This drug may interact with other medicines. Tell your doctor and pharmacist about all the medicines and dietary supplements (vitamins, minerals, herbs and others) that you are taking at this time. The safety and use of dietary supplements and alternative diets are often not known. Using these might affect your cancer or interfere with your treatment. Until more is known, you should not use dietary supplements or alternative diets without your cancer doctor's help.  When to Call the Doctor Call your doctor or nurse if you have any of these symptoms and/or any new or unusual symptoms: . Fever of 100.5 F (38 C) or higher . Chills . Pain in your chest . Dry cough . Trouble breathing . Confusion and/or agitation . Hallucinations .  Trouble understanding or speaking . Blurry vision or changes in your eyesight . Numbness or lack of strength to your arms, legs, face, or body . Blurred vision or other changes in eyesight . Loose bowel movements (diarrhea) 4 times or loose bowel movements with lack of strength or a feeling of being dizzy . Pain in your abdomen that does not go away . Blood in your stool . No bowel movement in 3 days or when you feel uncomfortable . Nausea that stops you from eating or drinking and/or is not relieved by prescribed medicines . Throwing up more than 3 times a day . Lasting loss of appetite or rapid weight loss of five pounds in a week . Pain or burning when you pass urine. . Difficulty urinating . Feeling like you have to pass urine often, but not much comes out when you do. . Tender or heavy feeling in your lower abdomen. . Cloudy urine  and/or urine that smells bad. . Pain on one side of your back under your ribs. This is where your kidneys are. . Abnormal blood sugar . Unusual thirst, passing urine often, headache, sweating, shakiness, irritability . Pain that does not go away, or is not relieved by prescribed medicines . Fatigue that interferes with your daily activities . Signs of infusion reaction: fever or shaking chills, flushing, facial swelling, feeling dizzy, headache, trouble breathing, rash, itching, chest tightness, or chest pain. . Signs of possible liver problems: dark urine, pale bowel movements, bad stomach pain, feeling very tired and weak, unusual itching, or yellowing of the eyes or skin . If you think you may be pregnant        SELF CARE ACTIVITIES WHILE ON CHEMOTHERAPY: Hydration Increase your fluid intake 48 hours prior to treatment and drink at least 8 to 12 cups (64 ounces) of water/decaff beverages per day after treatment. You can still have your cup of coffee or soda but these beverages do not count as part of your 8 to 12 cups that you need to drink daily. No alcohol intake.  Medications Continue taking your normal prescription medication as prescribed.  If you start any new herbal or new supplements please let us know first to make sure it is safe.  Mouth Care Have teeth cleaned professionally before starting treatment. Keep dentures and partial plates clean. Use soft toothbrush and do not use mouthwashes that contain alcohol. Biotene is a good mouthwash that is available at most pharmacies or may be ordered by calling [phone]. Use warm salt water gargles (1 teaspoon salt per 1 quart warm water) before and after meals and at bedtime. Or you may rinse with 2 tablespoons of three-percent hydrogen peroxide mixed in eight ounces of water. If you are still having problems with your mouth or sores in your mouth please call the clinic. If you need dental work, please let the doctor know  before you go for your appointment so that we can coordinate the best possible time for you in regards to your chemo regimen. You need to also let your dentist know that you are actively taking chemo. We may need to do labs prior to your dental appointment.  Skin Care Always use sunscreen that has not expired and with SPF (Sun Protection Factor) of 50 or higher. Wear hats to protect your head from the sun. Remember to use sunscreen on your hands, ears, face, & feet.  Use good moisturizing lotions such as udder cream, eucerin, or even Vaseline. Some chemotherapies can  cause dry skin, color changes in your skin and nails.     Avoid long, hot showers or baths.  Use gentle, fragrance-free soaps and laundry detergent.  Use moisturizers, preferably creams or ointments rather than lotions because the thicker consistency is better at preventing skin dehydration. Apply the cream or ointment within 15 minutes of showering. Reapply moisturizer at night, and moisturize your hands every time after you wash them.  Hair Loss (if your doctor says your hair will fall out)   If your doctor says that your hair is likely to fall out, decide before you begin chemo whether you want to wear a wig. You may want to shop before treatment to match your hair color.  Hats, turbans, and scarves can also camouflage hair loss, although some people prefer to leave their heads uncovered. If you go bare-headed outdoors, be sure to use sunscreen on your scalp.  Cut your hair short. It eases the inconvenience of shedding lots of hair, but it also can reduce the emotional impact of watching your hair fall out.  Don't perm or color your hair during chemotherapy. Those chemical treatments are already damaging to hair and can enhance hair loss. Once your chemo treatments are done and your hair has grown back, it's OK to resume dyeing or perming hair. With chemotherapy, hair loss is almost always temporary. But when it grows back,  it may be a different color or texture. In older adults who still had hair color before chemotherapy, the new growth may be completely gray.  Often, new hair is very fine and soft.  Infection Prevention Please wash your hands for at least 30 seconds using warm soapy water. Handwashing is the #1 way to prevent the spread of germs. Stay away from sick people or people who are getting over a cold. If you develop respiratory systems such as green/yellow mucus production or productive cough or persistent cough let us know and we will see if you need an antibiotic. It is a good idea to keep a pair of gloves on when going into grocery stores/[employer] to decrease your risk of coming into contact with germs on the carts, etc. Carry alcohol hand gel with you at all times and use it frequently if out in public. If your temperature reaches 100.5 or higher please call the clinic and let us know.  If it is after hours or on the weekend please go to the ER if your temperature is over 100.5.  Please have your own personal thermometer at home to use.    Sex and bodily fluids If you are going to have sex, a condom must be used to protect the person that isn't taking chemotherapy. Chemo can decrease your libido (sex drive). For a few days after chemotherapy, chemotherapy can be excreted through your bodily fluids.  When using the toilet please close the lid and flush the toilet twice.  Do this for a few day after you have had chemotherapy.   Effects of chemotherapy on your sex life Some changes are simple and won't last long. They won't affect your sex life permanently. Sometimes you may feel:  too tired  not strong enough to be very active  sick or sore   not in the mood  anxious or low Your anxiety might not seem related to sex. For example, you may be worried about the cancer and how your treatment is going. Or you may be worried about money, or about how you family are coping with  your illness. These things  can cause stress, which can affect your interest in sex. It's important to talk to your partner about how you feel. Remember - the changes to your sex life don't usually last long. There's usually no medical reason to stop having sex during chemo. The drugs won't have any long term physical effects on your performance or enjoyment of sex. Cancer can't be passed on to your partner during sex  Contraception It's important to use reliable contraception during treatment. Avoid getting pregnant while you or your partner are having chemotherapy. This isbecause the drugs may harm the baby. Sometimes chemotherapy drugs can leave a man or woman infertile.  This means you would not be able to have children in the future. You might want to talk to someone about permanent infertility. It can be very difficult to learn that you may no longer be able to have children. Some people find counselling helpful. There might be ways to preserve your fertility, although this is easier for men than for women. You may want to speak to a fertility expert. You can talk about sperm banking or harvesting your eggs. You can also ask about other fertility options, such as donor eggs. If you haveor have had breast cancer, your doctor might advise you not to take the contraceptive pill. This is because the hormones in it might affect the cancer. It is not known for sure whether or not chemotherapy drugs can be passed on through semen or secretions from the vagina. Because of this some doctors advise people to use a barrier method if you have sex during treatment. This applies to vaginal, anal or oral sex. Generally, doctors advise a barrier method only for the time you are actually having the treatment and for about a week after your treatment. Advice like this can be worrying, but this does not mean that you have to avoid being intimate with your partner. You can still have close contact with your partner and continue to enjoy  sex.  Animals If you have cats or birds we just ask that you not change the litter or change the cage.  Please have someone else do this for you while you are on chemotherapy.   Food Safety During and After Cancer Treatment Food safety is important for people both during and after cancer treatment. Cancer and cancer treatments, such as chemotherapy, radiation therapy, and stem cell/bone marrow transplantation, often weaken the immune system. This makes it harder for your body to protect itself from foodborne illness, also called food poisoning. Foodborne illness is caused by eating food that contains harmful bacteria, parasites, or viruses.  Foods to avoid Some foods have a higher risk of becoming tainted with bacteria. These include:  Unwashed fresh fruit and vegetables, especially leafy vegetables that can hide dirt and other contaminants  Raw sprouts, such as alfalfa sprouts  Raw or undercooked beef, especially ground beef, or other raw or undercooked meat and poultry  Fatty, fried, or spicy foods immediately before or after treatment.  These can sit heavy on your stomach and make you feel nauseous.  Raw or undercooked shellfish, such as oysters.  Sushi and sashimi, which often contain raw fish.   Unpasteurized beverages, such as unpasteurized fruit juices, raw milk, raw yogurt, or cider  Undercooked eggs, such as soft boiled, over easy, and poached; raw, unpasteurized eggs; or foods made with raw egg, such as homemade raw cookie dough and homemade mayonnaise Simple steps for food safety Shop smart.  Do not buy food stored or displayed in an unclean area.  Do not buy bruised or damaged fruits or vegetables.  Do not buy cans that have cracks, dents, or bulges.  Pick up foods that can spoil at the end of your shopping trip and store them in a cooler on the way home. Prepare and clean up foods carefully.  Rinse all fresh fruits and vegetables under running water, and dry them  with a clean towel or paper towel.  Clean the top of cans before opening them.  After preparing food, wash your hands for 20 seconds with hot water and soap. Pay special attention to areas between fingers and under nails.  Clean your utensils and dishes with hot water and soap.  Disinfect your kitchen and cutting boards using 1 teaspoon of liquid, unscented bleach mixed into 1 quart of water.  Dispose of old food.  Eat canned and packaged food before its expiration date (the "use by" or "best before" date).  Consume refrigerated leftovers within 3 to 4 days. After that time, throw out the food. Even if the food does not smell or look spoiled, it still may be unsafe. Some bacteria, such as Listeria, can grow even on foods stored in the refrigerator if they are kept for too long.   Take precautions when eating out.  At restaurants, avoid buffets and salad bars where food sits out for a long time and comes in contact with many people. Food can become contaminated when someone with a virus, often a norovirus, or another "bug" handles it.  Put any leftover food in a "to-go" container yourself, rather than having the server do it. And, refrigerate leftovers as soon as you get home.  Choose restaurants that are clean and that are willing to prepare your food as you order it cooked.    MEDICATIONS:    Compazine/Prochlorperazine 10mg  tablet. Take 1 tablet every 6 hours as needed for nausea/vomiting. (can make you sleepy)  Over-the-Counter Meds:  Colace 100mg  capsule. Take 2 capsules daily for constipation/hard stool. If no relief, you may take 2 capsules in the morning and 2 capsules in the evening. If no relief, call [facility] and we will call in something to your drug store.   Imodium 2mg  capsule. Take 2 capsules after the 1st loose stool and then 1 capsule after each loose stool but do not exceed 8 capsules in a 24 hour period. Call [place] if loose stools  continue. If diarrhea occurs @ bedtime, take 2 capsules @ bedtime. Then take 2 capsules every 4 hours until morning. Call [place].  Constipation Sheet  Imodium 2mg  capsule. Take 2 capsules after the 1st loose stool and then 1 capsule after each loose stool but do not exceed 8 capsules in a 24 hour period. Call [place] if loose stools continue. If diarrhea occurs @ bedtime, take 2 capsules @ bedtime. Then take 2 capsules every 4 hours until morning. Call [place].  Please call if the above does not work for you.   Do not go more than 2 days without a bowel movement.  It is very important that you do not become constipated.  It will make you feel sick to your stomach (nausea) and can cause abdominal pain and vomiting.    Diarrhea Sheet  If you are having loose stools/diarrhea, please purchase Imodium and begin taking as outlined:   Imodium 2mg  capsule. Take 2 capsules after the 1st loose stool and then  1 capsule after each loose stool but do not exceed 8 capsules in a 24 hour period. Call [place] if loose stools continue. If diarrhea occurs @ bedtime, take 2 capsules @ bedtime. Then take 2 capsules every 4 hours until morning. Call [place].  Always call the [facility] if you are having loose stools/diarrhea that you can't get under control.  Loose stools/disrrhea leads to dehydration (loss of water) in your body.  We have other options of trying to get the loose stools/diarrhea to stopped but you must let us know!  Nausea Sheet   Compazine/Prochlorperazine 10mg  tablet. Take 1 tablet every 6 hours as needed for nausea/vomiting. (can make you sleepy)  Please call the [facility] and let us know the amount of nausea that you are experiencing.  If you begin to vomit, you need to call the [facility] and if it is the weekend and you have vomited more than one time and cant get it to stop-go to the Emergency Room.  Persistent nausea/vomiting can lead to  dehydration (loss of fluid in your body) and will make you feel terrible.   Ice chips, sips of clear liquids, foods that are @ room temperature, crackers, and toast tend to be better tolerated.    SYMPTOMS TO REPORT AS SOON AS POSSIBLE AFTER TREATMENT:  FEVER GREATER THAN 100.5 F  CHILLS WITH OR WITHOUT FEVER  NAUSEA AND VOMITING THAT IS NOT CONTROLLED WITH YOUR NAUSEA MEDICATION  UNUSUAL SHORTNESS OF BREATH  UNUSUAL BRUISING OR BLEEDING  TENDERNESS IN MOUTH AND THROAT WITH OR WITHOUT PRESENCE OF ULCERS  URINARY PROBLEMS  BOWEL PROBLEMS  UNUSUAL RASH    Wear comfortable clothing and clothing appropriate for easy access to any Portacath or PICC line. Let us know if there is anything that we can do to make your therapy better!    What to do if you need assistance after hours or on the weekends: CALL [phone].  HOLD on the line, do not hang up.  You will hear multiple messages but at the end you will be connected with a nurse triage line.  They will contact the doctor if necessary.  Most of the time they will be able to assist you.  Do not call the hospital operator.     I have been informed and understand all of the instructions given to me and have received a copy. I have been instructed to call the clinic [phone] or my family physician as soon as possible for continued medical care, if indicated. I do not have any more questions at this time but understand that I may call the [facility] or the Patient Navigator at [phone] during office hours should I have questions or need assistance in obtaining follow-up care.

## 2017-06-19 NOTE — Progress Notes (Signed)
START ON PATHWAY REGIMEN - Small Cell Lung     Cycles 1 through 4, every 21 days:     Atezolizumab      Carboplatin      Etoposide    Cycles 5 and beyond, every 21 days:     Atezolizumab   **Always confirm dose/schedule in your pharmacy ordering system**  Patient Characteristics: Extensive Stage, First Line Stage Classification: Extensive AJCC T Category: TX AJCC N Category: N2 AJCC M Category: M1c AJCC 8 Stage Grouping: IVB Line of therapy: First Line  Intent of Therapy: Non-Curative / Palliative Intent, Discussed with Patient

## 2017-06-20 ENCOUNTER — Ambulatory Visit (HOSPITAL_COMMUNITY): Payer: Medicare Other

## 2017-06-20 DIAGNOSIS — C3490 Malignant neoplasm of unspecified part of unspecified bronchus or lung: Secondary | ICD-10-CM | POA: Diagnosis not present

## 2017-06-20 MED ORDER — SODIUM CHLORIDE 0.9 % IV SOLN: INTRAVENOUS | Status: AC

## 2017-06-20 MED ORDER — DEXAMETHASONE 2 MG PO TABS: 2.0000 mg | ORAL_TABLET | Freq: Every day | ORAL | 0 refills | Status: AC

## 2017-06-21 ENCOUNTER — Ambulatory Visit: Payer: Medicare Other | Admitting: Family Medicine

## 2017-06-21 ENCOUNTER — Encounter (HOSPITAL_COMMUNITY): Payer: Self-pay

## 2017-06-21 ENCOUNTER — Inpatient Hospital Stay (HOSPITAL_COMMUNITY): Payer: Medicare Other

## 2017-06-21 ENCOUNTER — Ambulatory Visit (HOSPITAL_COMMUNITY)
Admission: RE | Admit: 2017-06-21 | Discharge: 2017-06-21 | Disposition: A | Discharge status: Home / Self Care | Payer: Medicare Other | Source: Ambulatory Visit | Attending: Hematology | Admitting: Hematology

## 2017-06-21 DIAGNOSIS — C3490 Malignant neoplasm of unspecified part of unspecified bronchus or lung: Secondary | ICD-10-CM | POA: Diagnosis not present

## 2017-06-21 DIAGNOSIS — Z5111 Encounter for antineoplastic chemotherapy: Secondary | ICD-10-CM | POA: Diagnosis not present

## 2017-06-21 DIAGNOSIS — C781 Secondary malignant neoplasm of mediastinum: Secondary | ICD-10-CM | POA: Diagnosis not present

## 2017-06-21 DIAGNOSIS — R590 Localized enlarged lymph nodes: Secondary | ICD-10-CM | POA: Diagnosis not present

## 2017-06-21 DIAGNOSIS — J90 Pleural effusion, not elsewhere classified: Secondary | ICD-10-CM | POA: Diagnosis not present

## 2017-06-21 DIAGNOSIS — Z452 Encounter for adjustment and management of vascular access device: Secondary | ICD-10-CM

## 2017-06-21 DIAGNOSIS — R05 Cough: Secondary | ICD-10-CM | POA: Diagnosis not present

## 2017-06-21 DIAGNOSIS — Z79899 Other long term (current) drug therapy: Secondary | ICD-10-CM | POA: Diagnosis not present

## 2017-06-21 DIAGNOSIS — J9819 Other pulmonary collapse: Secondary | ICD-10-CM | POA: Diagnosis not present

## 2017-06-21 LAB — CBC WITH DIFFERENTIAL/PLATELET
BASOS ABS: 0 10*3/uL (ref 0.0–0.1)
Basophils Relative: 0 %
EOS PCT: 0 %
Eosinophils Absolute: 0 10*3/uL (ref 0.0–0.7)
HEMATOCRIT: 44.3 % (ref 36.0–46.0)
Hemoglobin: 14.4 g/dL (ref 12.0–15.0)
LYMPHS ABS: 1.4 10*3/uL (ref 0.7–4.0)
Lymphocytes Relative: 13 %
MCH: 31 pg (ref 26.0–34.0)
MCHC: 32.5 g/dL (ref 30.0–36.0)
MCV: 95.5 fL (ref 78.0–100.0)
Monocytes Absolute: 1 10*3/uL (ref 0.1–1.0)
Monocytes Relative: 10 %
NEUTROS ABS: 8.1 10*3/uL — AB (ref 1.7–7.7)
Neutrophils Relative %: 77 %
Platelets: 247 10*3/uL (ref 150–400)
RBC: 4.64 MIL/uL (ref 3.87–5.11)
RDW: 14.2 % (ref 11.5–15.5)
WBC: 10.7 10*3/uL — AB (ref 4.0–10.5)

## 2017-06-21 LAB — COMPREHENSIVE METABOLIC PANEL
ALT: 87 U/L — ABNORMAL HIGH (ref 14–54)
ANION GAP: 12 (ref 5–15)
AST: 109 U/L — ABNORMAL HIGH (ref 15–41)
Albumin: 3.3 g/dL — ABNORMAL LOW (ref 3.5–5.0)
Alkaline Phosphatase: 103 U/L (ref 38–126)
BILIRUBIN TOTAL: 1.2 mg/dL (ref 0.3–1.2)
BUN: 63 mg/dL — AB (ref 6–20)
CHLORIDE: 105 mmol/L (ref 101–111)
CO2: 24 mmol/L (ref 22–32)
Calcium: 9.2 mg/dL (ref 8.9–10.3)
Creatinine, Ser: 1.83 mg/dL — ABNORMAL HIGH (ref 0.44–1.00)
GFR calc Af Amer: 29 mL/min — ABNORMAL LOW (ref >60)
GFR calc non Af Amer: 25 mL/min — ABNORMAL LOW (ref >60)
GLUCOSE: 109 mg/dL — AB (ref 65–99)
Potassium: 4 mmol/L (ref 3.5–5.1)
SODIUM: 141 mmol/L (ref 135–145)
TOTAL PROTEIN: 6.5 g/dL (ref 6.5–8.1)

## 2017-06-21 MED ORDER — SODIUM CHLORIDE 0.9 % IV SOLN
INTRAVENOUS | Status: DC
  Administered 2017-06-21: 12:00:00 via INTRAVENOUS

## 2017-06-21 MED ORDER — ALLOPURINOL 300 MG PO TABS: 300.0000 mg | ORAL_TABLET | Freq: Every day | ORAL | 0 refills | Status: DC

## 2017-06-21 NOTE — Progress Notes (Signed)
Nutrition  Patient identified on Malnutrition Screening tool for poor appetite and weight loss.    Chart reviewed and noted patient in clinic this pm for PICC line placement and fluids.    Introduced self and service to patient.  Patient reports that she does not need a dietitian at this time.  Appreciative of visit.  Contact information given to patient and patient knows to reach out to patient if needed.  Anai Lipson B. Zenia Resides, [place], [place] Registered Dietitian [phone] (pager)

## 2017-06-21 NOTE — Patient Instructions (Signed)
[place] at [facility] Discharge Instructions  Received hydration NS over 3 hours today as well as PICC line placement per IV team. Follow-up as scheduled. Call clinic for any questions or concerns   Thank you for choosing [place] at [facility] to provide your oncology and hematology care.  To afford each patient quality time with our provider, please arrive at least 15 minutes before your scheduled appointment time.   If you have a lab appointment with the [facility] please come in thru the  Main Entrance and check in at the main information desk  You need to re-schedule your appointment should you arrive 10 or more minutes late.  We strive to give you quality time with our providers, and arriving late affects you and other patients whose appointments are after yours.  Also, if you no show three or more times for appointments you may be dismissed from the clinic at the providers discretion.     Again, thank you for choosing [facility].  Our hope is that these requests will decrease the amount of time that you wait before being seen by our physicians.       _____________________________________________________________  Should you have questions after your visit to [facility], please contact our office at [phone] between the hours of 8:30 a.m. and 4:30 p.m.  Voicemails left after 4:30 p.m. will not be returned until the following business day.  For prescription refill requests, have your pharmacy contact our office.       Resources For Cancer Patients and their Caregivers ? American Cancer Society: Can assist with transportation, wigs, general needs, runs Look Good Feel Better.        ([phone] ? Cancer Care: Provides financial assistance, online support groups, medication/co-pay assistance.  1-800-813-HOPE 708-044-4645) ? [place] Assists [place] cancer patients and  their families through emotional , educational and financial support.  202-522-7787 ? [place] DSS Where to apply for food stamps, Medicaid and utility assistance. 919-701-5815 ? RCATS: Transportation to medical appointments. (438) 285-0200 ? Social Security Administration: May apply for disability if have a Stage IV cancer. 301-356-3609 (438)564-6681 ? [employer], Disability and Transit Services: Assists with nutrition, care and transit needs. [place] Support Programs:   > Cancer Support Group  2nd Tuesday of the month 1pm-2pm, Journey Room   > Creative Journey  3rd Tuesday of the month 1130am-1pm, Journey Room

## 2017-06-21 NOTE — Progress Notes (Signed)
Matie A Szczesny tolerated PICC line placement, by Vascular access specialist, and hydration thru peripheral IV well without complaints or incident. Pt spoke to H.Bray RN at length regarding possible chemotherapy treatment this Monday. VSS upon discharge. PICC line site and dressing clean,dry and intact. Pt denies any problems at this time. Pt discharged via wheelchair in satisfactory condition accompanied by family member

## 2017-06-21 NOTE — Progress Notes (Signed)
Peripherally Inserted Central Catheter/Midline Placement  The IV Nurse has discussed with the patient and/or persons authorized to consent for the patient, the purpose of this procedure and the potential benefits and risks involved with this procedure.  The benefits include less needle sticks, lab draws from the catheter, and the patient may be discharged home with the catheter. Risks include, but not limited to, infection, bleeding, blood clot (thrombus formation), and puncture of an artery; nerve damage and irregular heartbeat and possibility to perform a PICC exchange if needed/ordered by physician.  Alternatives to this procedure were also discussed.  Bard Power PICC patient education guide, fact sheet on infection prevention and patient information card has been provided to patient /or left at bedside.    PICC/Midline Placement Documentation  PICC Single Lumen 06/21/17 PICC Right Brachial 38 cm 0 cm (Active)       Jule Economy Horton 06/21/2017, 2:01 PM

## 2017-06-24 ENCOUNTER — Ambulatory Visit (HOSPITAL_COMMUNITY): Payer: Medicare Other

## 2017-06-24 ENCOUNTER — Other Ambulatory Visit (HOSPITAL_COMMUNITY): Payer: Medicare Other

## 2017-06-24 ENCOUNTER — Inpatient Hospital Stay (HOSPITAL_COMMUNITY): Payer: Medicare Other

## 2017-06-24 ENCOUNTER — Encounter (HOSPITAL_COMMUNITY): Payer: Self-pay

## 2017-06-24 VITALS — BP 151/74 | HR 86 | Temp 98.1°F | Resp 18 | Wt 94.2 lb

## 2017-06-24 DIAGNOSIS — Z5111 Encounter for antineoplastic chemotherapy: Secondary | ICD-10-CM | POA: Diagnosis not present

## 2017-06-24 DIAGNOSIS — J9819 Other pulmonary collapse: Secondary | ICD-10-CM | POA: Diagnosis not present

## 2017-06-24 DIAGNOSIS — C3490 Malignant neoplasm of unspecified part of unspecified bronchus or lung: Secondary | ICD-10-CM

## 2017-06-24 DIAGNOSIS — R05 Cough: Secondary | ICD-10-CM | POA: Diagnosis not present

## 2017-06-24 DIAGNOSIS — C781 Secondary malignant neoplasm of mediastinum: Secondary | ICD-10-CM | POA: Diagnosis not present

## 2017-06-24 DIAGNOSIS — Z79899 Other long term (current) drug therapy: Secondary | ICD-10-CM | POA: Diagnosis not present

## 2017-06-24 LAB — COMPREHENSIVE METABOLIC PANEL
ALBUMIN: 3.2 g/dL — AB (ref 3.5–5.0)
ALK PHOS: 118 U/L (ref 38–126)
ALT: 102 U/L — AB (ref 14–54)
ANION GAP: 11 (ref 5–15)
AST: 129 U/L — ABNORMAL HIGH (ref 15–41)
BILIRUBIN TOTAL: 1.4 mg/dL — AB (ref 0.3–1.2)
BUN: 55 mg/dL — ABNORMAL HIGH (ref 6–20)
CALCIUM: 9.2 mg/dL (ref 8.9–10.3)
CO2: 25 mmol/L (ref 22–32)
CREATININE: 1.44 mg/dL — AB (ref 0.44–1.00)
Chloride: 106 mmol/L (ref 101–111)
GFR calc Af Amer: 39 mL/min — ABNORMAL LOW (ref >60)
GFR calc non Af Amer: 34 mL/min — ABNORMAL LOW (ref >60)
GLUCOSE: 123 mg/dL — AB (ref 65–99)
Potassium: 4.1 mmol/L (ref 3.5–5.1)
Sodium: 142 mmol/L (ref 135–145)
TOTAL PROTEIN: 6.2 g/dL — AB (ref 6.5–8.1)

## 2017-06-24 LAB — CBC WITH DIFFERENTIAL/PLATELET
BASOS ABS: 0 10*3/uL (ref 0.0–0.1)
BASOS PCT: 0 %
EOS ABS: 0 10*3/uL (ref 0.0–0.7)
EOS PCT: 0 %
HCT: 44.9 % (ref 36.0–46.0)
Hemoglobin: 14.5 g/dL (ref 12.0–15.0)
Lymphocytes Relative: 8 %
Lymphs Abs: 0.8 10*3/uL (ref 0.7–4.0)
MCH: 30.9 pg (ref 26.0–34.0)
MCHC: 32.3 g/dL (ref 30.0–36.0)
MCV: 95.7 fL (ref 78.0–100.0)
MONO ABS: 0.5 10*3/uL (ref 0.1–1.0)
Monocytes Relative: 5 %
NEUTROS ABS: 8.9 10*3/uL — AB (ref 1.7–7.7)
Neutrophils Relative %: 87 %
PLATELETS: 203 10*3/uL (ref 150–400)
RBC: 4.69 MIL/uL (ref 3.87–5.11)
RDW: 14.5 % (ref 11.5–15.5)
WBC: 10.2 10*3/uL (ref 4.0–10.5)

## 2017-06-24 LAB — LACTATE DEHYDROGENASE: LDH: 891 U/L — AB (ref 98–192)

## 2017-06-24 MED ORDER — SODIUM CHLORIDE 0.9 % IV SOLN
75.0000 mg/m2 | Freq: Once | INTRAVENOUS | Status: AC
  Administered 2017-06-24: 100 mg via INTRAVENOUS
  Filled 2017-06-24: qty 5

## 2017-06-24 MED ORDER — SODIUM CHLORIDE 0.9 % IV SOLN
1200.0000 mg | Freq: Once | INTRAVENOUS | Status: AC
  Administered 2017-06-24: 1200 mg via INTRAVENOUS
  Filled 2017-06-24: qty 20

## 2017-06-24 MED ORDER — SODIUM CHLORIDE 0.9 % IV SOLN
INTRAVENOUS | Status: DC
  Administered 2017-06-24: 12:00:00 via INTRAVENOUS

## 2017-06-24 MED ORDER — SODIUM CHLORIDE 0.9 % IV SOLN
Freq: Once | INTRAVENOUS | Status: AC
  Administered 2017-06-24: 13:00:00 via INTRAVENOUS

## 2017-06-24 MED ORDER — PALONOSETRON HCL INJECTION 0.25 MG/5ML
INTRAVENOUS | Status: AC
  Filled 2017-06-24: qty 5

## 2017-06-24 MED ORDER — SODIUM CHLORIDE 0.9 % IV SOLN
233.0000 mg | Freq: Once | INTRAVENOUS | Status: AC
  Administered 2017-06-24: 230 mg via INTRAVENOUS
  Filled 2017-06-24: qty 23

## 2017-06-24 MED ORDER — HEPARIN SOD (PORK) LOCK FLUSH 100 UNIT/ML IV SOLN: 250.0000 [IU] | Freq: Once | INTRAVENOUS | Status: DC | PRN

## 2017-06-24 MED ORDER — SODIUM CHLORIDE 0.9 % IV SOLN
Freq: Once | INTRAVENOUS | Status: AC
  Administered 2017-06-24: 12:00:00 via INTRAVENOUS
  Filled 2017-06-24: qty 5

## 2017-06-24 MED ORDER — PALONOSETRON HCL INJECTION 0.25 MG/5ML
0.2500 mg | Freq: Once | INTRAVENOUS | Status: AC
  Administered 2017-06-24: 0.25 mg via INTRAVENOUS

## 2017-06-24 MED ORDER — SODIUM CHLORIDE 0.9% FLUSH
3.0000 mL | INTRAVENOUS | Status: DC | PRN
  Administered 2017-06-24: 3 mL
  Filled 2017-06-24: qty 10

## 2017-06-24 NOTE — Progress Notes (Signed)
[place] reviewed with Dr. Delton Coombes and Brandy Gray approved for chemo tx today and extra hydration ordered with 500 ml NS over 1 hr per MD                      [place] RN(from IV team) retracted PICC line  2  cm and changed Brandy Gray's dressing and saline lock.                                                                                Brandy Gray tolerated chemo tx well without complaints or incident. PICC line with positive blood return prior to each chemo medication given and upon discharge. VSS upon discharge. Brandy Gray discharged via wheelchair in satisfactory condition accompanied by family member

## 2017-06-24 NOTE — Patient Instructions (Signed)
[facility] Discharge Instructions for Patients Receiving Chemotherapy   Beginning January 23rd 2017 lab work for the [facility] will be done in the  Main lab at [facility] on 1st floor. If you have a lab appointment with the [facility] please come in thru the  Main Entrance and check in at the main information desk   Today you received the following chemotherapy agents Tecentriq,VP-16 and Carboplatin. Follow-up as scheduled. Call clinic for any questions or concerns  To help prevent nausea and vomiting after your treatment, we encourage you to take your nausea medication   If you develop nausea and vomiting, or diarrhea that is not controlled by your medication, call the clinic.  The clinic phone number is [phone]. Office hours are Monday-Friday 8:30am-5:00pm.  BELOW ARE SYMPTOMS THAT SHOULD BE REPORTED IMMEDIATELY:  *FEVER GREATER THAN 101.0 F  *CHILLS WITH OR WITHOUT FEVER  NAUSEA AND VOMITING THAT IS NOT CONTROLLED WITH YOUR NAUSEA MEDICATION  *UNUSUAL SHORTNESS OF BREATH  *UNUSUAL BRUISING OR BLEEDING  TENDERNESS IN MOUTH AND THROAT WITH OR WITHOUT PRESENCE OF ULCERS  *URINARY PROBLEMS  *BOWEL PROBLEMS  UNUSUAL RASH Items with * indicate a potential emergency and should be followed up as soon as possible. If you have an emergency after office hours please contact your primary care physician or go to the nearest emergency department.  Please call the clinic during office hours if you have any questions or concerns.   You may also contact the Patient Navigator at [phone] should you have any questions or need assistance in obtaining follow up care.      Resources For Cancer Patients and their Caregivers ? American Cancer Society: Can assist with transportation, wigs, general needs, runs Look Good Feel Better.        458-484-4329 ? Cancer Care: Provides financial assistance, online support groups, medication/co-pay assistance.   1-800-813-HOPE 980-367-9753) ? [place] Assists [place] cancer patients and their families through emotional , educational and financial support.  430 331 9062 ? [place] DSS Where to apply for food stamps, Medicaid and utility assistance. 780-283-3839 ? RCATS: Transportation to medical appointments. 806 436 6574 ? Social Security Administration: May apply for disability if have a Stage IV cancer. ([phone] [phone] ? [employer], Disability and Transit Services: Assists with nutrition, care and transit needs. 704-693-1718

## 2017-06-25 ENCOUNTER — Inpatient Hospital Stay (HOSPITAL_COMMUNITY): Payer: Medicare Other

## 2017-06-25 ENCOUNTER — Ambulatory Visit (HOSPITAL_COMMUNITY): Payer: Medicare Other | Admitting: Hematology

## 2017-06-25 ENCOUNTER — Ambulatory Visit (HOSPITAL_COMMUNITY)
Admission: RE | Admit: 2017-06-25 | Discharge: 2017-06-25 | Disposition: A | Discharge status: Home / Self Care | Payer: Medicare Other | Source: Ambulatory Visit | Attending: Hematology | Admitting: Hematology

## 2017-06-25 ENCOUNTER — Encounter (HOSPITAL_COMMUNITY): Payer: Self-pay

## 2017-06-25 ENCOUNTER — Ambulatory Visit (HOSPITAL_COMMUNITY): Payer: Medicare Other

## 2017-06-25 VITALS — BP 141/70 | HR 79 | Temp 98.2°F | Resp 18

## 2017-06-25 DIAGNOSIS — C781 Secondary malignant neoplasm of mediastinum: Secondary | ICD-10-CM | POA: Diagnosis not present

## 2017-06-25 DIAGNOSIS — E279 Disorder of adrenal gland, unspecified: Secondary | ICD-10-CM | POA: Diagnosis not present

## 2017-06-25 DIAGNOSIS — C787 Secondary malignant neoplasm of liver and intrahepatic bile duct: Secondary | ICD-10-CM | POA: Insufficient documentation

## 2017-06-25 DIAGNOSIS — C3490 Malignant neoplasm of unspecified part of unspecified bronchus or lung: Secondary | ICD-10-CM | POA: Diagnosis not present

## 2017-06-25 DIAGNOSIS — Z5111 Encounter for antineoplastic chemotherapy: Secondary | ICD-10-CM | POA: Diagnosis not present

## 2017-06-25 DIAGNOSIS — Z79899 Other long term (current) drug therapy: Secondary | ICD-10-CM | POA: Diagnosis not present

## 2017-06-25 DIAGNOSIS — J9819 Other pulmonary collapse: Secondary | ICD-10-CM | POA: Diagnosis not present

## 2017-06-25 DIAGNOSIS — R05 Cough: Secondary | ICD-10-CM | POA: Diagnosis not present

## 2017-06-25 MED ORDER — HEPARIN SOD (PORK) LOCK FLUSH 100 UNIT/ML IV SOLN: 500.0000 [IU] | Freq: Once | INTRAVENOUS | Status: DC | PRN

## 2017-06-25 MED ORDER — SODIUM CHLORIDE 0.9% FLUSH
10.0000 mL | INTRAVENOUS | Status: DC | PRN
  Administered 2017-06-25: 10 mL
  Filled 2017-06-25: qty 10

## 2017-06-25 MED ORDER — SODIUM CHLORIDE 0.9 % IV SOLN
75.0000 mg/m2 | Freq: Once | INTRAVENOUS | Status: AC
  Administered 2017-06-25: 100 mg via INTRAVENOUS
  Filled 2017-06-25: qty 5

## 2017-06-25 MED ORDER — DEXAMETHASONE SODIUM PHOSPHATE 10 MG/ML IJ SOLN
INTRAMUSCULAR | Status: AC
  Filled 2017-06-25: qty 1

## 2017-06-25 MED ORDER — SODIUM CHLORIDE 0.9 % IV SOLN
Freq: Once | INTRAVENOUS | Status: AC
Start: 2017-06-25 — End: 2017-06-25
  Administered 2017-06-25: 13:00:00 via INTRAVENOUS

## 2017-06-25 MED ORDER — SODIUM CHLORIDE 0.9 % IV SOLN: 10.0000 mg | Freq: Once | INTRAVENOUS | Status: DC

## 2017-06-25 MED ORDER — DEXAMETHASONE SODIUM PHOSPHATE 10 MG/ML IJ SOLN
10.0000 mg | Freq: Once | INTRAMUSCULAR | Status: AC
  Administered 2017-06-25: 10 mg via INTRAVENOUS

## 2017-06-25 NOTE — Progress Notes (Signed)
Patient tolerated chemotherapy with no complaints voiced.  PICC line intact with no redness or complaints of pain at site.  Good blood return noted before and after administration of chemo.  Line flushed per protocol.  Patient left by wheelchair with family at side.  No s/s of distress noted.

## 2017-06-25 NOTE — Patient Instructions (Signed)
[facility] Discharge Instructions for Patients Receiving Chemotherapy  Today you received the following chemotherapy agents etoposide.   If you develop nausea and vomiting that is not controlled by your nausea medication, call the clinic.   BELOW ARE SYMPTOMS THAT SHOULD BE REPORTED IMMEDIATELY:  *FEVER GREATER THAN 100.5 F  *CHILLS WITH OR WITHOUT FEVER  NAUSEA AND VOMITING THAT IS NOT CONTROLLED WITH YOUR NAUSEA MEDICATION  *UNUSUAL SHORTNESS OF BREATH  *UNUSUAL BRUISING OR BLEEDING  TENDERNESS IN MOUTH AND THROAT WITH OR WITHOUT PRESENCE OF ULCERS  *URINARY PROBLEMS  *BOWEL PROBLEMS  UNUSUAL RASH Items with * indicate a potential emergency and should be followed up as soon as possible.  Feel free to call the clinic should you have any questions or concerns. The clinic phone number is [phone].  Please show the [place] at check-in to the Emergency Department and triage nurse.

## 2017-06-26 ENCOUNTER — Ambulatory Visit (HOSPITAL_COMMUNITY): Payer: Medicare Other

## 2017-06-26 ENCOUNTER — Inpatient Hospital Stay (HOSPITAL_COMMUNITY): Payer: Medicare Other

## 2017-06-26 VITALS — BP 129/81 | HR 84 | Temp 98.1°F | Resp 18 | Wt 100.4 lb

## 2017-06-26 DIAGNOSIS — Z79899 Other long term (current) drug therapy: Secondary | ICD-10-CM | POA: Diagnosis not present

## 2017-06-26 DIAGNOSIS — J9819 Other pulmonary collapse: Secondary | ICD-10-CM | POA: Diagnosis not present

## 2017-06-26 DIAGNOSIS — C3490 Malignant neoplasm of unspecified part of unspecified bronchus or lung: Secondary | ICD-10-CM

## 2017-06-26 DIAGNOSIS — C781 Secondary malignant neoplasm of mediastinum: Secondary | ICD-10-CM | POA: Diagnosis not present

## 2017-06-26 DIAGNOSIS — R05 Cough: Secondary | ICD-10-CM | POA: Diagnosis not present

## 2017-06-26 DIAGNOSIS — Z5111 Encounter for antineoplastic chemotherapy: Secondary | ICD-10-CM | POA: Diagnosis not present

## 2017-06-26 MED ORDER — DEXAMETHASONE SODIUM PHOSPHATE 10 MG/ML IJ SOLN
10.0000 mg | Freq: Once | INTRAMUSCULAR | Status: AC
  Administered 2017-06-26: 10 mg via INTRAVENOUS
  Filled 2017-06-26: qty 1

## 2017-06-26 MED ORDER — PEGFILGRASTIM 6 MG/0.6ML ~~LOC~~ PSKT
6.0000 mg | PREFILLED_SYRINGE | Freq: Once | SUBCUTANEOUS | Status: DC
  Filled 2017-06-26: qty 0.6

## 2017-06-26 MED ORDER — SODIUM CHLORIDE 0.9 % IV SOLN
75.0000 mg/m2 | Freq: Once | INTRAVENOUS | Status: AC
  Administered 2017-06-26: 100 mg via INTRAVENOUS
  Filled 2017-06-26: qty 5

## 2017-06-26 MED ORDER — SODIUM CHLORIDE 0.9 % IV SOLN: 10.0000 mg | Freq: Once | INTRAVENOUS | Status: DC

## 2017-06-26 MED ORDER — SODIUM CHLORIDE 0.9 % IV SOLN
Freq: Once | INTRAVENOUS | Status: AC
Start: 2017-06-26 — End: 2017-06-26
  Administered 2017-06-26: 13:00:00 via INTRAVENOUS

## 2017-06-26 NOTE — Progress Notes (Signed)
Pt reports that today will be the last day she will be receiving chemotherapy.  She states, "I'm done after this.".  States she would like all of her future appointments cancelled, and that she would like to have her PICC line pulled today.  Dr. Delton Coombes made aware of this and instructed me to encourage patient to follow up with him next week for lab check and office visit, and to pull PICC line at that time.  Pt advised of this but refuses any further appointments or tx, and insists that PICC line be pulled today.  Pt offered hospice referral but refuses.  MD made aware.  Pt also refusing Neulasta OnPro after watching educational video and receiving education from RN, stating, "I think I've been through enough already; I don't want to do that.".    Tolerated infusion w/o adverse reaction.  Alert, in no distress.    1530 - PICC Removal Note: PICC line removed from right antecubital after sterile site prepped per protocol. PICC catheter tip visualized and intact. Pressure dressing with Vasoline gauze directly to insertion site applied with kling and hypofix tape. A: No redness, ecchymosis, edema, swelling, or drainage noted at site. P: Instructions provided on post PICC discharge care, including followup notification instructions.  1600:  VSS.  Discharged via wheelchair in c/o grandson.

## 2017-06-26 NOTE — Patient Instructions (Signed)
PICC Removal, Care After Refer to this sheet in the next few weeks. These instructions provide you with information on caring for yourself after your procedure. Your health care provider may also give you more specific instructions. Your treatment has been planned according to current medical practices, but problems sometimes occur. Call your health care provider if you have any problems or questions after your procedure. What can I expect after the procedure? After your procedure, it is typical to have mild discomfort at the insertion site. This should not last for more than a day. Follow these instructions at home: You may remove the bandage after 24 hours. The PICC insertion site is very small. A small scab may develop over the insertion site. It is okay to wash the site gently with soap and water. Be careful not to remove or pick off the scab. Gently pat the site dry after washing it. You do not need to put another bandage over the insertion site. Do not lift anything heavy or do strenuous physical activity for 24 hours after the PICC is removed. This includes:  Weight lifting.  Strenuous yard work.  Any physical activity with repetitive arm movement.  Contact a health care provider if:  You have swelling or puffiness in your arm at the PICC insertion site.  You have increasing tenderness at the PICC insertion site. Get help right away if:  You have numbness or tingling in your fingers, hand, or arm.  Your arm looks blue and feels cold.  You have redness around the insertion site or a red streak goes up your arm.  You have any type of drainage from the PICC insertion site. This includes drainage such as: ? Bleeding from the insertion site. If this happens, apply firm, direct pressure to the PICC insertion site with a clean towel. ? Drainage that is yellow or tan.  You have a fever. This information is not intended to replace advice given to you by your health care provider. Make  sure you discuss any questions you have with your health care provider. Document Released: 12/30/2012 Document Revised: 06/02/2015 Document Reviewed: 10/17/2012 [employer] Interactive Patient Education  2017 [employer]  

## 2017-06-27 ENCOUNTER — Ambulatory Visit (HOSPITAL_COMMUNITY): Payer: Medicare Other

## 2017-06-27 ENCOUNTER — Other Ambulatory Visit (HOSPITAL_COMMUNITY): Payer: Medicare Other

## 2017-06-27 ENCOUNTER — Telehealth (HOSPITAL_COMMUNITY): Payer: Self-pay | Admitting: *Deleted

## 2017-06-27 NOTE — Telephone Encounter (Signed)
Contacted pt for 24 hour f/u post Cycle 1 - reports feeling "a little weak" but otherwise doing okay.  Reports dressing where PICC line was removed is clean and intact.  Denies pain, swelling of RUE.

## 2017-07-01 ENCOUNTER — Encounter (HOSPITAL_COMMUNITY): Payer: Medicare Other

## 2017-07-02 ENCOUNTER — Ambulatory Visit: Payer: Medicare Other | Admitting: General Surgery

## 2017-07-09 ENCOUNTER — Telehealth (HOSPITAL_COMMUNITY): Payer: Self-pay | Admitting: *Deleted

## 2017-07-09 NOTE — Telephone Encounter (Signed)
Patient's daughter in law called and stated that patient is agreeable to be referred to Hospice. Will make referral to Hospice today.

## 2017-07-15 ENCOUNTER — Ambulatory Visit (HOSPITAL_COMMUNITY): Payer: Medicare Other

## 2017-07-15 ENCOUNTER — Other Ambulatory Visit (HOSPITAL_COMMUNITY): Payer: Medicare Other

## 2017-07-15 ENCOUNTER — Ambulatory Visit (HOSPITAL_COMMUNITY): Payer: Medicare Other | Admitting: Hematology

## 2017-07-16 ENCOUNTER — Ambulatory Visit (HOSPITAL_COMMUNITY): Payer: Medicare Other

## 2017-07-17 ENCOUNTER — Ambulatory Visit (HOSPITAL_COMMUNITY): Payer: Medicare Other

## 2017-07-18 ENCOUNTER — Other Ambulatory Visit (HOSPITAL_COMMUNITY): Payer: Self-pay | Admitting: Hematology

## 2017-07-18 DIAGNOSIS — C3490 Malignant neoplasm of unspecified part of unspecified bronchus or lung: Secondary | ICD-10-CM

## 2017-08-05 ENCOUNTER — Other Ambulatory Visit (HOSPITAL_COMMUNITY): Payer: Self-pay | Admitting: *Deleted

## 2017-08-05 DIAGNOSIS — C3490 Malignant neoplasm of unspecified part of unspecified bronchus or lung: Secondary | ICD-10-CM

## 2017-08-05 MED ORDER — ALLOPURINOL 300 MG PO TABS: 300.0000 mg | ORAL_TABLET | Freq: Every day | ORAL | 0 refills | Status: AC

## 2017-08-08 DEATH — deceased

## 2019-09-25 IMAGING — US US CHEST/MEDIASTINUM
1 series · 5 of 5 positions shown · non-contrast
Comparison: Chest radiograph 05/30/2017

CLINICAL DATA: Abnormal chest radiograph, RIGHT pleural effusion

EXAM:
CHEST ULTRASOUND

[Series 1: us chest/mediastinum · 0.22mm/px · 5 of 5 slices shown]
[im 1/5]
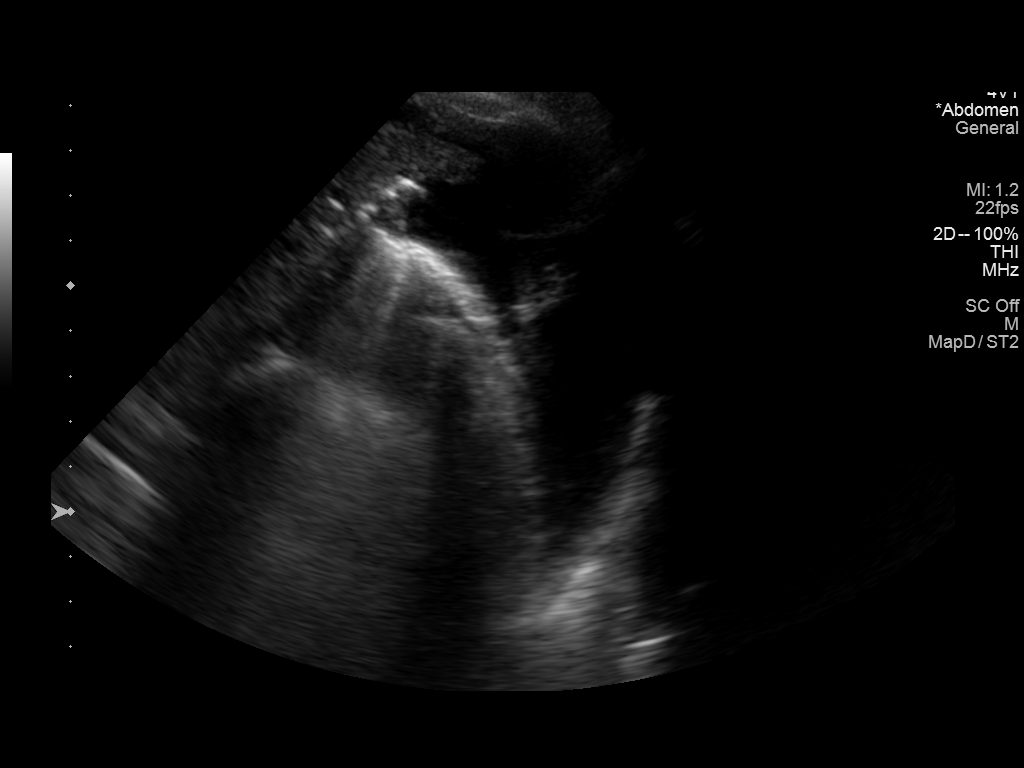
[im 2/5]
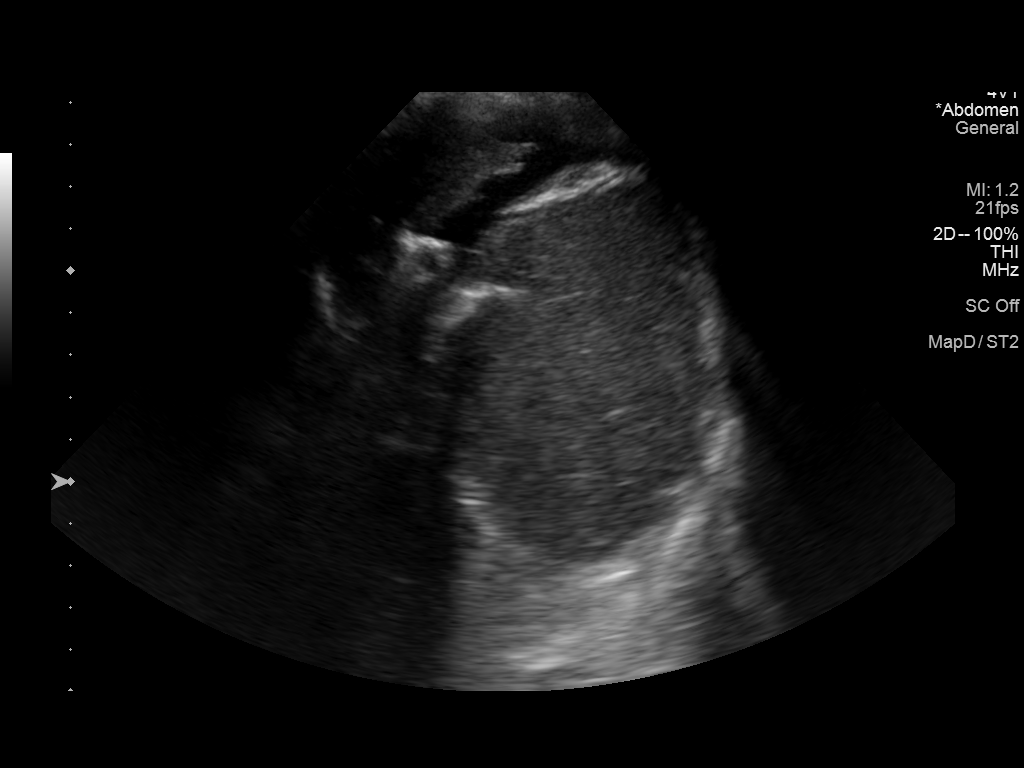
[im 3/5]
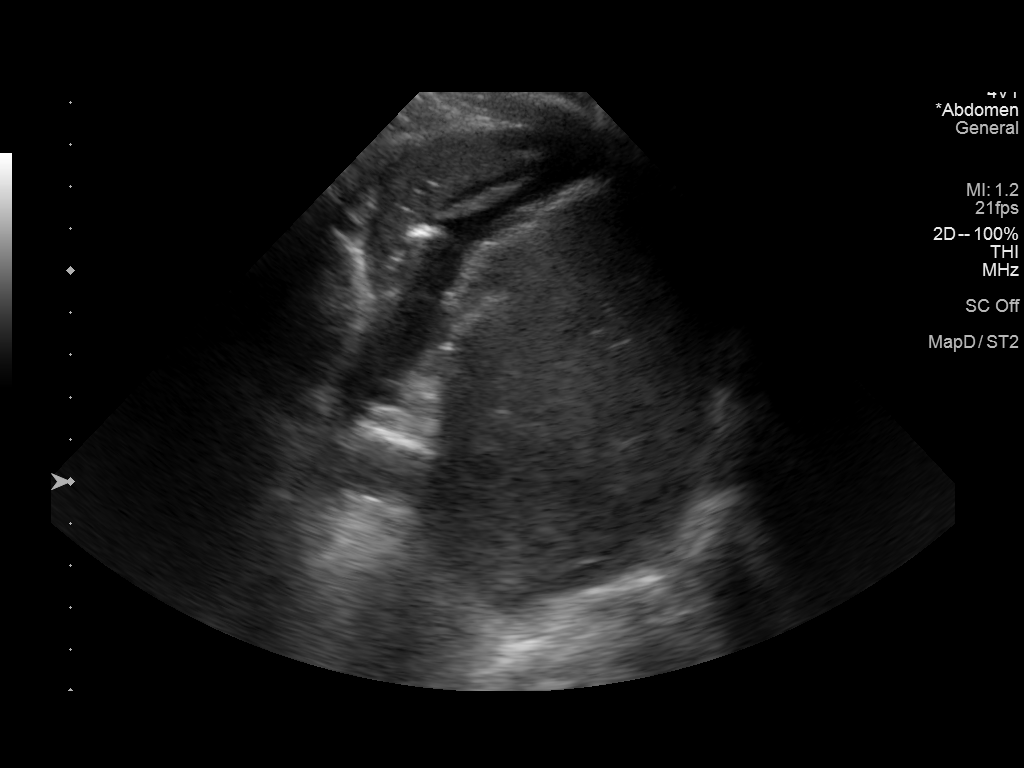
[im 4/5]
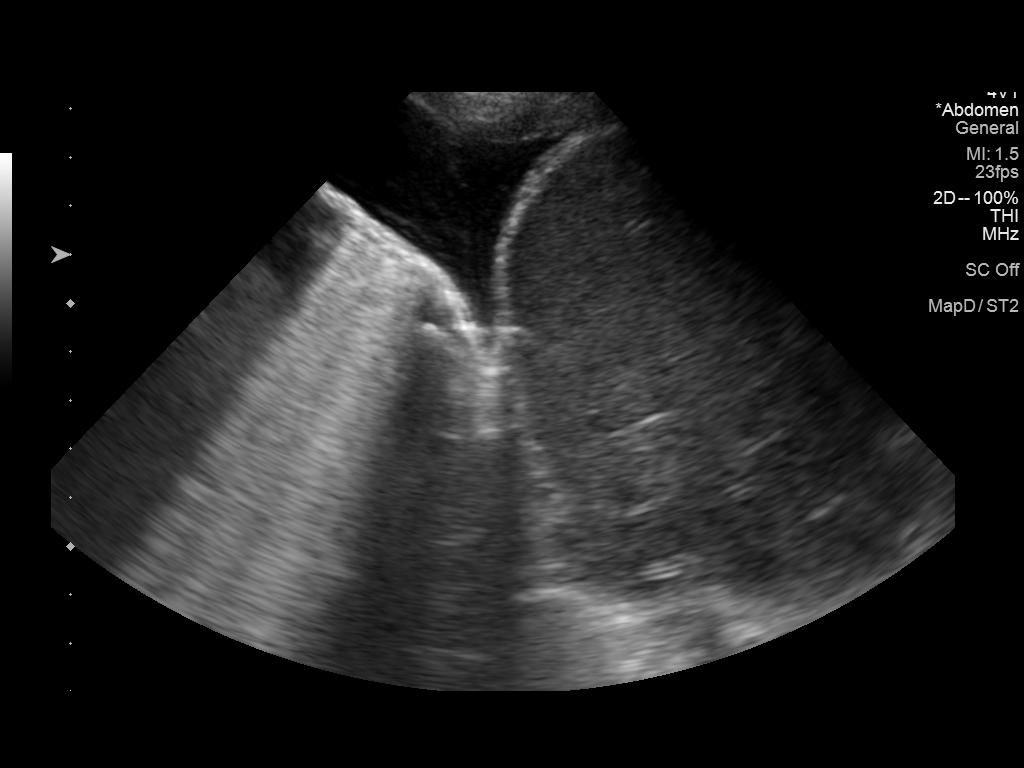
[im 5/5]
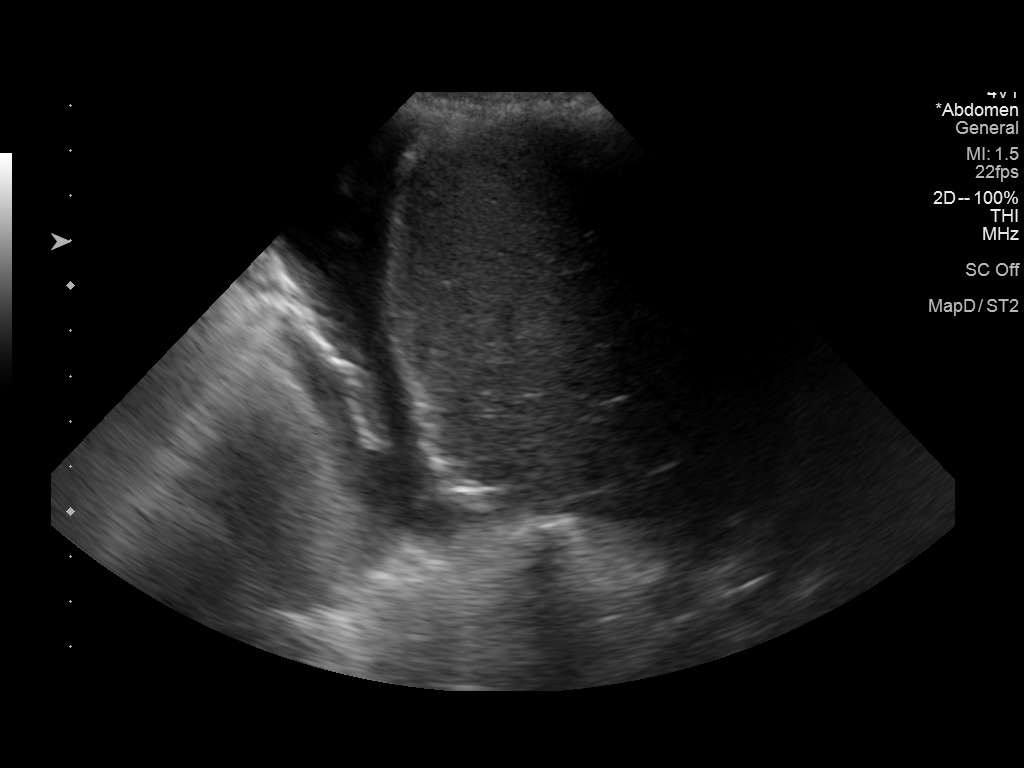

[5 of 5 positions shown; findings below may reference images not displayed]

FINDINGS: Sonography of the RIGHT pleural space performed in anticipation of
thoracentesis.

Only a small RIGHT pleural effusion is identified.

Volume of RIGHT pleural fluid present is insufficient for
thoracentesis.
IMPRESSION: Insufficient RIGHT pleural effusion for thoracentesis.

Discussed with Dr. Taffy; thoracentesis will not be performed and
will instead proceed with already planned CT chest with contrast.

## 2019-10-20 IMAGING — CT CT ABD-PELV W/O CM
2 of 4 series · 15 of 46 positions shown, 17 images · non-contrast
Comparison: Chest CT 05/31/2017

CLINICAL DATA: Small cell lung cancer.

EXAM:
CT ABDOMEN AND PELVIS WITHOUT CONTRAST
TECHNIQUE: Multidetector CT imaging of the abdomen and pelvis was performed
following the standard protocol without IV contrast.

[Series 2: axial st · axial · 0.59mm/px · z∈[-634,-264]mm · 12 of 82 slices shown, 14 images]
[im 4/82  soft-tissue]
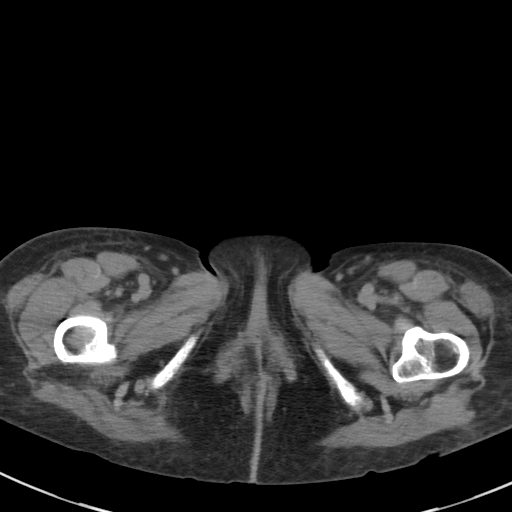
[im 4/82  bone]
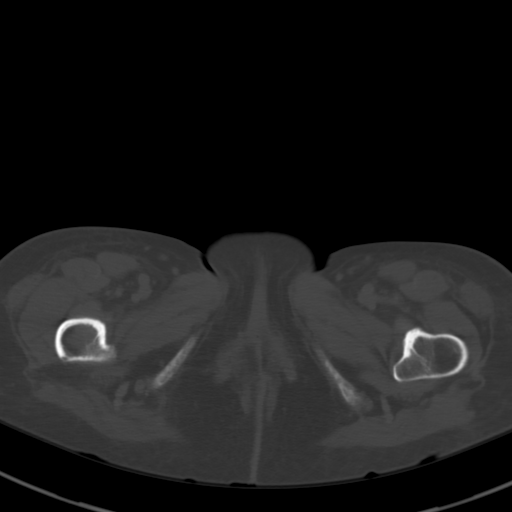
[im 11/82  soft-tissue]
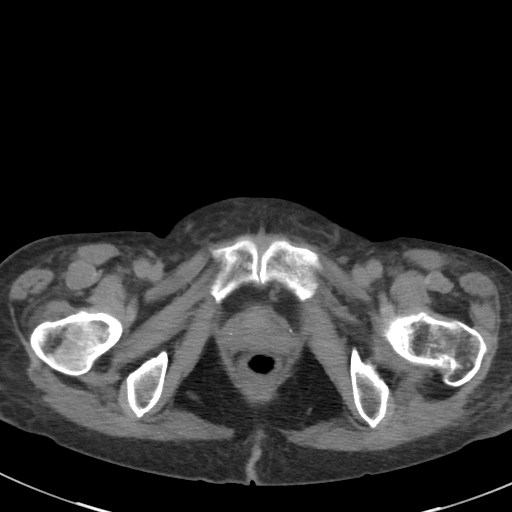
[im 17/82  soft-tissue]
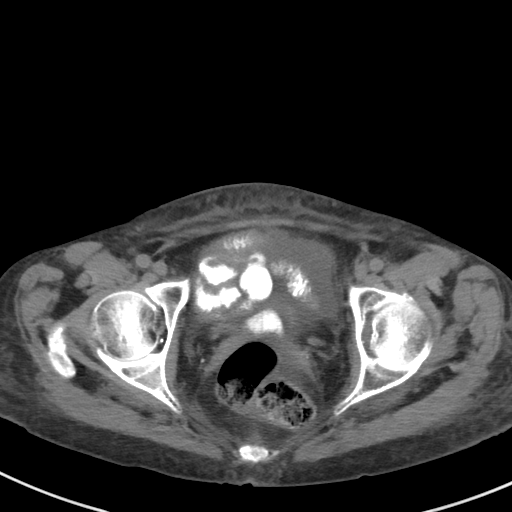
[im 24/82  soft-tissue]
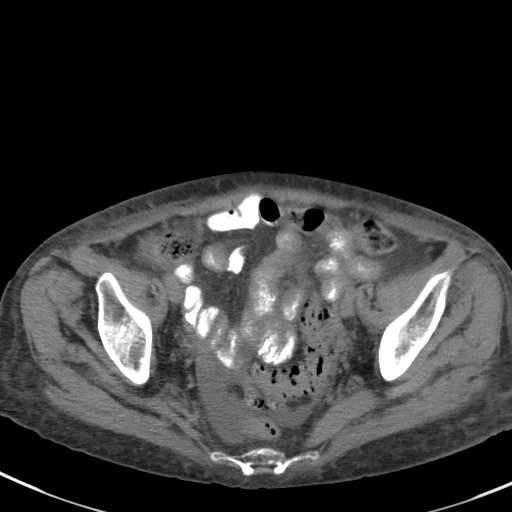
[im 31/82  soft-tissue]
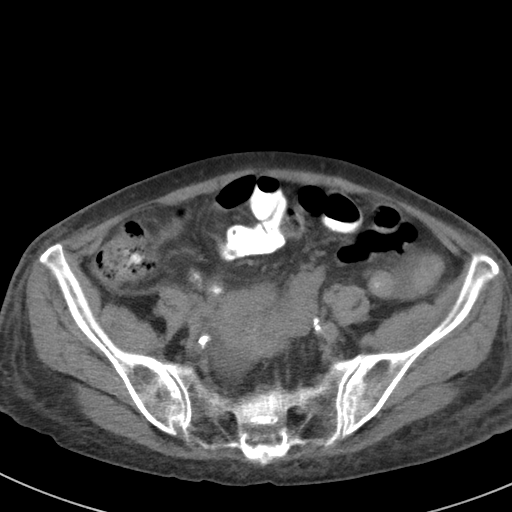
[im 38/82  soft-tissue]
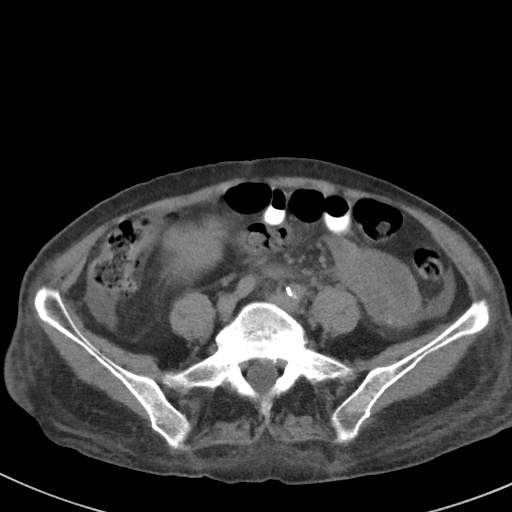
[im 44/82  soft-tissue]
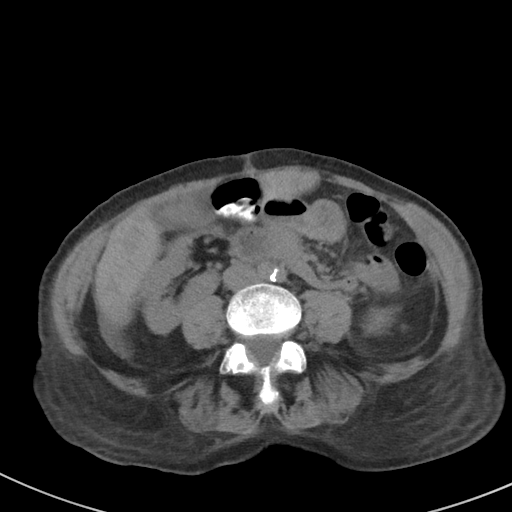
[im 51/82  soft-tissue]
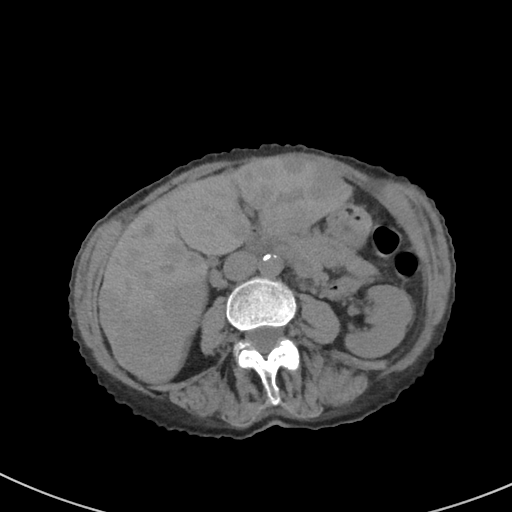
[im 58/82  soft-tissue]
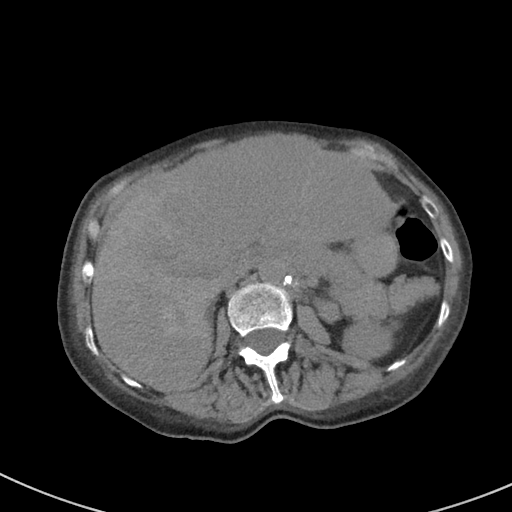
[im 58/82  bone]
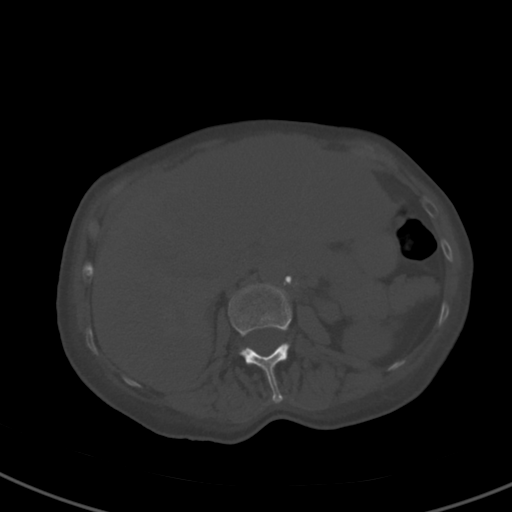
[im 65/82  soft-tissue]
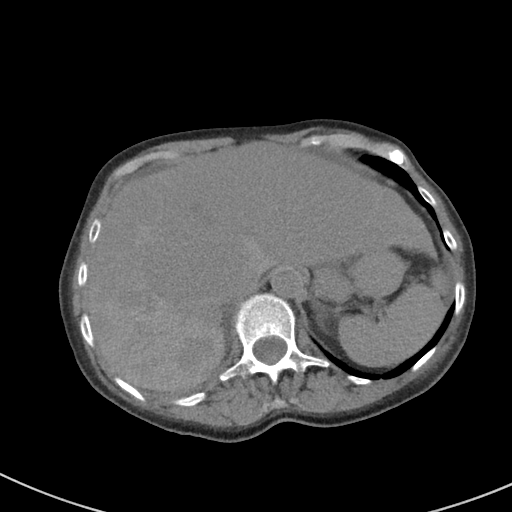
[im 71/82  soft-tissue]
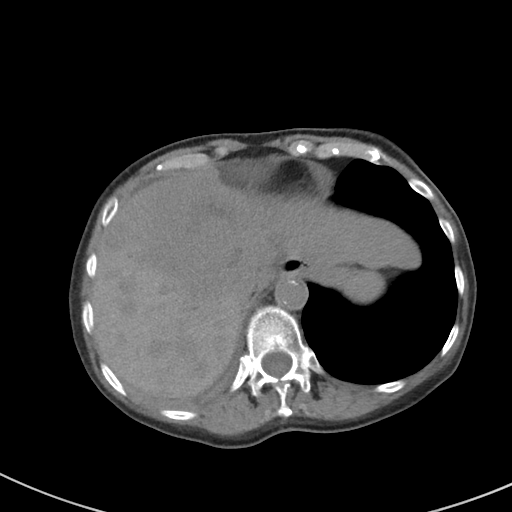
[im 78/82  soft-tissue]
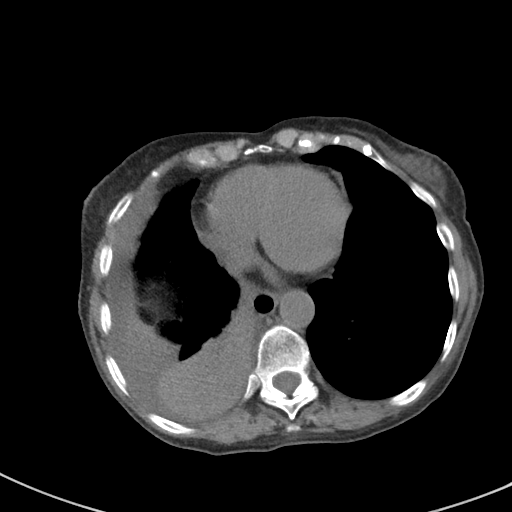

[Series 5: coronal st · coronal · 0.62mm/px · 3 of 74 slices shown]
[im 25/74  soft-tissue]
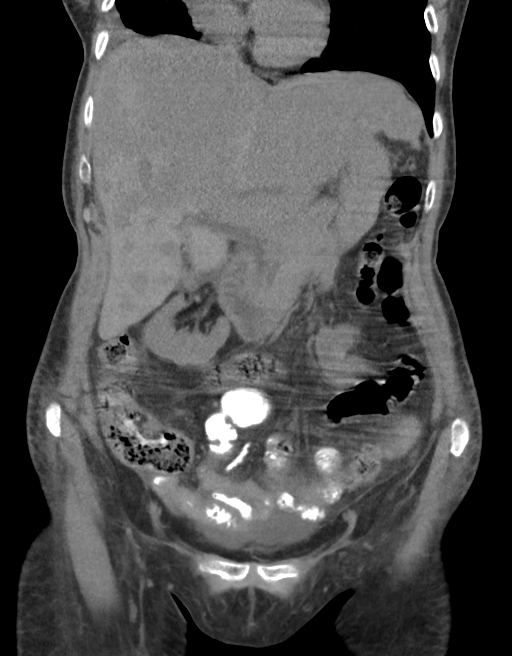
[im 33/74  soft-tissue]
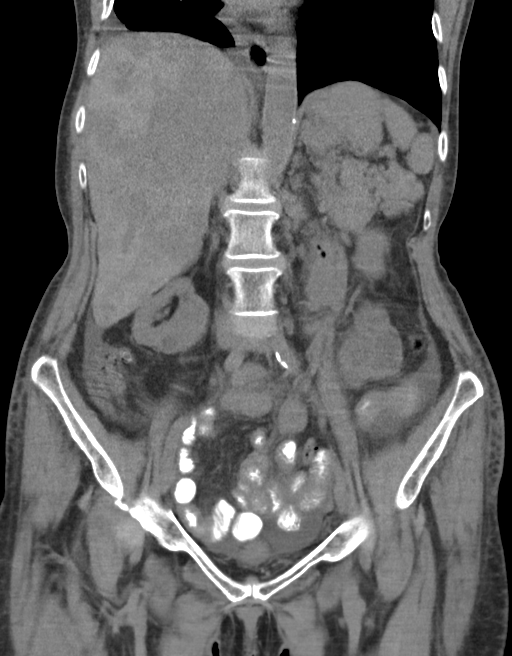
[im 41/74  soft-tissue]
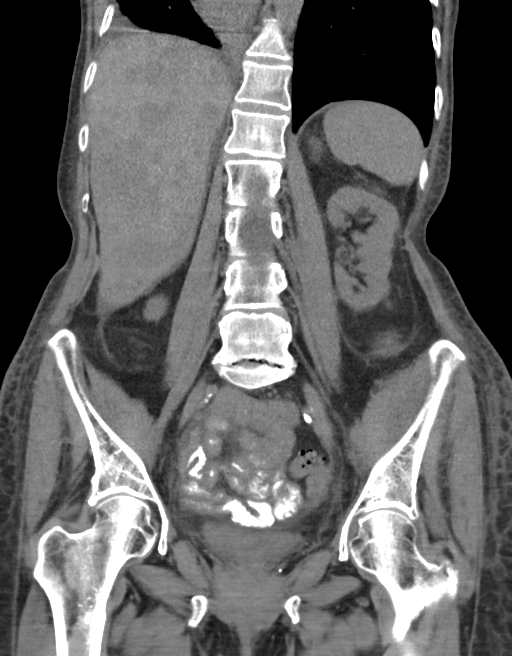

[15 of 46 positions shown; findings below may reference images not displayed]

FINDINGS: Lower chest: Small right pleural fluid collection and persistent
drowned/obstructed right lower lobe. No metastatic nodules noted in
the left lower lobe.

Hepatobiliary: Diffuse and extensive hepatic metastatic disease.
Large lesion or coalescent lesions occupying almost the entire left
hepatic lobe with suspected thrombosis of the left portal vein.

Pancreas: No obvious pancreatic mass and no ductal dilatation.

Spleen: Normal size.  No focal lesions.

Adrenals/Urinary Tract: Stable left adrenal gland lesion measuring
13 mm suspicious for metastasis. The right adrenal gland appears
normal.

Both kidneys are unremarkable. Duplicated collecting system noted on
the right. Small nodule in the right hepatorenal fossa possible
metastatic implant..

Decompressed thick walled bladder noted.

Stomach/Bowel: The stomach, duodenum, small bowel and colon are
grossly normal. No obvious acute inflammatory changes, mass lesions
or obstructive findings.

Vascular/Lymphatic: Advanced atherosclerotic calcifications
involving the aorta and iliac arteries. No aneurysm.

No obvious abdominal or pelvic lymphadenopathy.

Reproductive: The uterus is surgically absent. I believe the left
ovary is still present and appears normal.

Other: Free pelvic fluid. No obvious pelvic mass or adenopathy. No
inguinal mass or adenopathy.

Mild diffuse body wall edema may suggest anasarca.

Small subcutaneous nodule noted overlying the oblique abdominal
muscles on the left side on image number 37.

Musculoskeletal: No significant bony findings. No obvious lytic or
sclerotic bone metastasis.
IMPRESSION: 1. Diffuse extensive hepatic metastatic disease as detailed above.
The entire left hepatic lobe is occupied by tumor. Suspect left
portal vein thrombosis.
2. Stable left adrenal gland lesion, likely metastatic disease.
3. Possible small metastatic implant in the hepatorenal fossa and
near the left kidney and near the oblique abdominal muscles on the
left side.
4. No definite findings for osseous metastatic disease.
